# Patient Record
Sex: Female | Born: 1955 | Race: White | Hispanic: No | Smoking: Never smoker
Health system: Southern US, Community
[De-identification: ages and names within clinical notes are randomized; demographics above are authoritative.]

## PROBLEM LIST (undated history)

## (undated) DIAGNOSIS — N952 Postmenopausal atrophic vaginitis: Secondary | ICD-10-CM

## (undated) DIAGNOSIS — R112 Nausea with vomiting, unspecified: Secondary | ICD-10-CM

## (undated) DIAGNOSIS — F419 Anxiety disorder, unspecified: Secondary | ICD-10-CM

## (undated) DIAGNOSIS — Z9889 Other specified postprocedural states: Secondary | ICD-10-CM

## (undated) DIAGNOSIS — M1990 Unspecified osteoarthritis, unspecified site: Secondary | ICD-10-CM

## (undated) HISTORY — PX: COLONOSCOPY: SHX174

## (undated) HISTORY — DX: Hypercalcemia: E83.52

## (undated) HISTORY — DX: Anxiety disorder, unspecified: F41.9

## (undated) HISTORY — DX: Postmenopausal atrophic vaginitis: N95.2

## (undated) HISTORY — PX: KNEE ARTHROSCOPY: SUR90

## (undated) HISTORY — PX: FOOT SURGERY: SHX648

## (undated) HISTORY — DX: Unspecified osteoarthritis, unspecified site: M19.90

---

## 1998-02-18 ENCOUNTER — Other Ambulatory Visit: Admission: RE | Admit: 1998-02-18 | Discharge: 1998-02-18 | Payer: Self-pay | Admitting: Obstetrics and Gynecology

## 2002-10-17 ENCOUNTER — Other Ambulatory Visit: Admission: RE | Admit: 2002-10-17 | Discharge: 2002-10-17 | Payer: Self-pay | Admitting: Internal Medicine

## 2004-02-01 ENCOUNTER — Other Ambulatory Visit: Admission: RE | Admit: 2004-02-01 | Discharge: 2004-02-01 | Payer: Self-pay | Admitting: Internal Medicine

## 2005-02-01 ENCOUNTER — Ambulatory Visit: Payer: Self-pay | Admitting: Internal Medicine

## 2005-02-08 ENCOUNTER — Ambulatory Visit: Payer: Self-pay | Admitting: Internal Medicine

## 2005-02-08 ENCOUNTER — Other Ambulatory Visit: Admission: RE | Admit: 2005-02-08 | Discharge: 2005-02-08 | Payer: Self-pay | Admitting: Internal Medicine

## 2005-04-14 ENCOUNTER — Encounter: Payer: Self-pay | Admitting: Internal Medicine

## 2006-02-19 ENCOUNTER — Ambulatory Visit: Payer: Self-pay | Admitting: Internal Medicine

## 2006-02-27 ENCOUNTER — Ambulatory Visit: Payer: Self-pay | Admitting: Internal Medicine

## 2006-02-27 ENCOUNTER — Encounter: Payer: Self-pay | Admitting: Internal Medicine

## 2006-02-27 ENCOUNTER — Other Ambulatory Visit: Admission: RE | Admit: 2006-02-27 | Discharge: 2006-02-27 | Payer: Self-pay | Admitting: Internal Medicine

## 2006-03-14 ENCOUNTER — Ambulatory Visit: Payer: Self-pay | Admitting: Internal Medicine

## 2006-06-19 ENCOUNTER — Ambulatory Visit: Payer: Self-pay | Admitting: Internal Medicine

## 2007-04-04 ENCOUNTER — Ambulatory Visit: Payer: Self-pay | Admitting: Internal Medicine

## 2007-04-05 ENCOUNTER — Encounter: Payer: Self-pay | Admitting: Internal Medicine

## 2007-04-08 DIAGNOSIS — J309 Allergic rhinitis, unspecified: Secondary | ICD-10-CM | POA: Insufficient documentation

## 2007-04-09 ENCOUNTER — Ambulatory Visit: Payer: Self-pay | Admitting: Internal Medicine

## 2007-04-09 ENCOUNTER — Telehealth: Payer: Self-pay | Admitting: Internal Medicine

## 2007-04-09 LAB — CONVERTED CEMR LAB
Mumps IgG: 1.08 — ABNORMAL HIGH
Rubella: >500 intl units/mL — ABNORMAL HIGH
Rubeola IgG: 2.43 — ABNORMAL HIGH

## 2008-01-01 ENCOUNTER — Telehealth: Payer: Self-pay | Admitting: *Deleted

## 2008-01-07 ENCOUNTER — Ambulatory Visit: Payer: Self-pay | Admitting: Internal Medicine

## 2008-02-04 ENCOUNTER — Ambulatory Visit: Payer: Self-pay | Admitting: Internal Medicine

## 2008-08-17 ENCOUNTER — Encounter: Admission: RE | Admit: 2008-08-17 | Discharge: 2008-08-17 | Payer: Self-pay | Admitting: Obstetrics and Gynecology

## 2008-09-11 ENCOUNTER — Ambulatory Visit: Payer: Self-pay | Admitting: Internal Medicine

## 2008-09-22 ENCOUNTER — Ambulatory Visit: Payer: Self-pay | Admitting: Internal Medicine

## 2008-09-24 ENCOUNTER — Telehealth: Payer: Self-pay | Admitting: Internal Medicine

## 2010-08-22 ENCOUNTER — Telehealth: Payer: Self-pay | Admitting: Internal Medicine

## 2010-09-04 ENCOUNTER — Encounter (HOSPITAL_COMMUNITY): Payer: Self-pay | Admitting: Obstetrics and Gynecology

## 2010-09-15 NOTE — Progress Notes (Signed)
Summary: Call A Nurse   Call-A-Nurse Triage Call Report Triage Record Num: [id number] Operator: Alphonsa Overall Patient Name: Jenevieve Kirschbaum Call Date & Time: 08/20/2010 3:35:48PM Patient Phone: [phone] PCP: Neta Mends. Panosh Patient Gender: Female PCP Fax : [phone] Patient DOB: 1956-02-19 Practice Name: [facility] Reason for Call: Vallerie is calling, regarding a prescription written by Panosh for Effexor 37.5 mg 1 tab qd. Micki states off Effexor x3-4 days due to current stressors. Withdrawal from Effexor. Feeling increasingly agitated. [place] [facility] [phone] last fill date 01/2009 confirmed dose Effexor 37.5mg  tabs qd. Dr Tawanna Cooler paged for questions on safety of resuming script after being off for few days. Per Dr Tawanna Cooler to wait til home to restart medication, did not approve med to be called in out of town. If worsening s/s needs to seek Urgent Care. [facility] aware script not able to be called in, pt hung up phone before care advice able to be given. Protocol(s) Used: Depression Recommended Outcome per Protocol: See Provider within 24 hours Reason for Outcome: Worsening episodes of agitation or irritability Care Advice:  ~ 08/20/2010 4:24:46PM Page 1 of 1 CAN_  Patient called me  out of town about situation as above     had left bottle at home   ...concern about wd .signs ..    told  to call  back.  She texted me later that she found a bottle  with her   later.  [facility] MD

## 2010-09-15 NOTE — Letter (Signed)
Summary: Anner Crete MD  Anner Crete MD   Imported By: Sherian Rein 01/28/2010 10:04:25  _____________________________________________________________________  External Attachment:    Type:   Image     Comment:   External Document

## 2011-09-06 ENCOUNTER — Ambulatory Visit: Payer: 59 | Attending: Specialist | Admitting: Rehabilitative and Restorative Service Providers"

## 2011-09-06 DIAGNOSIS — M542 Cervicalgia: Secondary | ICD-10-CM | POA: Insufficient documentation

## 2011-09-06 DIAGNOSIS — IMO0001 Reserved for inherently not codable concepts without codable children: Secondary | ICD-10-CM | POA: Insufficient documentation

## 2011-09-06 DIAGNOSIS — M25519 Pain in unspecified shoulder: Secondary | ICD-10-CM | POA: Insufficient documentation

## 2011-09-11 ENCOUNTER — Ambulatory Visit: Payer: 59 | Admitting: Rehabilitative and Restorative Service Providers"

## 2011-09-14 ENCOUNTER — Ambulatory Visit: Payer: 59 | Admitting: Rehabilitation

## 2011-09-18 ENCOUNTER — Ambulatory Visit: Payer: 59 | Attending: Specialist | Admitting: Rehabilitative and Restorative Service Providers"

## 2011-09-18 DIAGNOSIS — M25519 Pain in unspecified shoulder: Secondary | ICD-10-CM | POA: Insufficient documentation

## 2011-09-18 DIAGNOSIS — M542 Cervicalgia: Secondary | ICD-10-CM | POA: Insufficient documentation

## 2011-09-18 DIAGNOSIS — IMO0001 Reserved for inherently not codable concepts without codable children: Secondary | ICD-10-CM | POA: Insufficient documentation

## 2011-09-21 ENCOUNTER — Ambulatory Visit: Payer: 59 | Admitting: Rehabilitative and Restorative Service Providers"

## 2011-09-21 ENCOUNTER — Encounter: Payer: 59 | Admitting: Rehabilitative and Restorative Service Providers"

## 2011-09-27 ENCOUNTER — Ambulatory Visit: Payer: 59 | Admitting: Rehabilitation

## 2011-09-29 ENCOUNTER — Ambulatory Visit: Payer: 59 | Admitting: Rehabilitation

## 2011-10-02 ENCOUNTER — Ambulatory Visit: Payer: 59 | Admitting: Rehabilitation

## 2011-10-04 ENCOUNTER — Ambulatory Visit: Payer: 59 | Admitting: Rehabilitation

## 2011-10-11 ENCOUNTER — Ambulatory Visit: Payer: 59 | Admitting: Rehabilitation

## 2012-07-16 ENCOUNTER — Encounter: Payer: Self-pay | Admitting: *Deleted

## 2012-07-16 ENCOUNTER — Telehealth: Payer: Self-pay | Admitting: Internal Medicine

## 2012-07-16 NOTE — Telephone Encounter (Signed)
Scheduled patient on 07/17/12 at 10:15/10:30 AM with Dr. Juanda Chance. Left a message for Clydie Braun at [facility] with appointment. Requested she call me and confirm appointment and give me referring physician.

## 2012-07-16 NOTE — Telephone Encounter (Signed)
Clydie Braun called and will fax records on patient. She will let us know by 4;00 PM if patient will be coming.

## 2012-07-17 ENCOUNTER — Encounter: Payer: Self-pay | Admitting: Internal Medicine

## 2012-07-17 ENCOUNTER — Ambulatory Visit (INDEPENDENT_AMBULATORY_CARE_PROVIDER_SITE_OTHER): Payer: BC Managed Care – PPO | Admitting: Internal Medicine

## 2012-07-17 VITALS — BP 98/62 | HR 66 | Ht 67.0 in | Wt 141.8 lb

## 2012-07-17 DIAGNOSIS — K5900 Constipation, unspecified: Secondary | ICD-10-CM

## 2012-07-17 NOTE — Patient Instructions (Addendum)
Julio Sicks NP

## 2012-07-17 NOTE — Progress Notes (Signed)
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 We saw her 3 years ago for a screening colonoscopy. Her prep was suboptimal although she took the full prep. She has been taking magnesium  products and most recently MiraLax 17 g daily. She tries to eat more fiber and be physically active but her job requires  sedentary work. Her weight has been stable. There is no family history of colon cancer. Her sister has an irritable bowel syndrome. She has been on Effexor. She has been evaluated for elevated calcium levels. She takes probiotics daily.   Past Medical History  Diagnosis Date  . Serum calcium elevated   . Vaginal atrophy   . Osteoarthritis   . Anxiety    Past Surgical History  Procedure Date  . Foot surgery     left; on multiple occasions  . Knee arthroscopy 2001, 2005    left x 2    reports that she has never smoked. She has never used smokeless tobacco. She reports that she drinks alcohol. She reports that she does not use illicit drugs. family history includes Alzheimer's disease in her father; Heart attack in her mother; and Heart disease in her maternal grandmother. Allergies  Allergen Reactions  . Penicillins     REACTION: hives        Review of Systems: Denies upper GI symptoms of nausea vomiting heartburn  The remainder of the 10 point ROS is negative except as outlined in H&P   Physical Exam: General appearance  Well developed, in no distress. Eyes- non icteric. HEENT nontraumatic, normocephalic. Mouth no lesions, tongue papillated, no cheilosis. Neck supple without adenopathy, thyroid not enlarged, no carotid bruits, no JVD. Lungs Clear to auscultation bilaterally. Cor normal S1, normal S2, regular rhythm, no murmur,  quiet precordium. Abdomen: Soft nontender with normoactive bowel sounds. No distention. No tenderness or fullness. Liver edge at costal  margin.  Rectal: Soft Hemoccult negative stool. Extremities no pedal edema. Skin no lesions. Neurological alert and oriented x 3. Psychological normal mood and affect.  Assessment and Plan:  Problem #1 Functional constipation most likely due to slow transit. She has  inadequate fiber intake and lack of physical activity. There is no evidence for occult GI blood loss or signs of pelvic relaxation. He have discussed MiraLax which is an appropriate laxative to take. She may titrate the dose. I have also suggested magnesium oxide and I gave her samples of Linzess 145 ug on a trial basis to see if she she would be interested in taking it. She will continue probiotics. Her last colonoscopy 3 years ago was a suboptimal exam and I suggested a repeat colonoscopy with double prep. She would prefer to wait to see how MiraLax works. I will see her on when an as basis and we will also check her TSH levels.   07/17/2012 Lina Sar

## 2012-07-18 NOTE — Addendum Note (Signed)
Addended by: Richardson Chiquito on: 07/18/2012 12:42 PM   Modules accepted: Orders

## 2012-07-22 ENCOUNTER — Other Ambulatory Visit (INDEPENDENT_AMBULATORY_CARE_PROVIDER_SITE_OTHER): Payer: BC Managed Care – PPO

## 2012-07-22 DIAGNOSIS — K5900 Constipation, unspecified: Secondary | ICD-10-CM

## 2012-07-23 ENCOUNTER — Other Ambulatory Visit: Payer: Self-pay | Admitting: *Deleted

## 2012-07-23 DIAGNOSIS — K5900 Constipation, unspecified: Secondary | ICD-10-CM

## 2012-07-23 MED ORDER — POLYETHYLENE GLYCOL 3350 17 GM/SCOOP PO POWD: 17.0000 g | Freq: Every day | ORAL | Status: DC

## 2012-12-20 ENCOUNTER — Other Ambulatory Visit: Payer: Self-pay | Admitting: *Deleted

## 2012-12-20 DIAGNOSIS — K5900 Constipation, unspecified: Secondary | ICD-10-CM

## 2012-12-20 MED ORDER — POLYETHYLENE GLYCOL 3350 17 GM/SCOOP PO POWD: 17.0000 g | Freq: Every day | ORAL | Status: DC

## 2013-08-20 ENCOUNTER — Encounter: Payer: Self-pay | Admitting: Internal Medicine

## 2014-02-26 ENCOUNTER — Encounter: Payer: Self-pay | Admitting: Internal Medicine

## 2014-04-03 ENCOUNTER — Encounter (HOSPITAL_COMMUNITY): Payer: Self-pay | Admitting: Emergency Medicine

## 2014-04-03 ENCOUNTER — Emergency Department (INDEPENDENT_AMBULATORY_CARE_PROVIDER_SITE_OTHER)
Admission: EM | Admit: 2014-04-03 | Discharge: 2014-04-03 | Disposition: A | Discharge status: Home / Self Care | Payer: No Typology Code available for payment source | Source: Home / Self Care | Attending: Family Medicine | Admitting: Family Medicine

## 2014-04-03 DIAGNOSIS — M25552 Pain in left hip: Secondary | ICD-10-CM

## 2014-04-03 DIAGNOSIS — M25519 Pain in unspecified shoulder: Secondary | ICD-10-CM

## 2014-04-03 DIAGNOSIS — M542 Cervicalgia: Secondary | ICD-10-CM

## 2014-04-03 DIAGNOSIS — M25559 Pain in unspecified hip: Secondary | ICD-10-CM

## 2014-04-03 DIAGNOSIS — M25512 Pain in left shoulder: Secondary | ICD-10-CM

## 2014-04-03 MED ORDER — METHOCARBAMOL 500 MG PO TABS: 500.0000 mg | ORAL_TABLET | Freq: Three times a day (TID) | ORAL | Status: DC | PRN
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
## 2014-04-03 NOTE — Discharge Instructions (Signed)
Thank you for coming in today. Come back or go to the emergency room if you notice new weakness new numbness problems walking or bowel or bladder problems. Take ibuprofen and use Robaxin as needed If not getting better followup with Dr. [facility].  Cervical Sprain A cervical sprain is an injury in the neck in which the strong, fibrous tissues (ligaments) that connect your neck bones stretch or tear. Cervical sprains can range from mild to severe. Severe cervical sprains can cause the neck vertebrae to be unstable. This can lead to damage of the spinal cord and can result in serious nervous system problems. The amount of time it takes for a cervical sprain to get better depends on the cause and extent of the injury. Most cervical sprains heal in 1 to 3 weeks. CAUSES  Severe cervical sprains may be caused by:   Contact sport injuries (such as from football, rugby, wrestling, hockey, auto racing, gymnastics, diving, martial arts, or boxing).   Motor vehicle collisions.   Whiplash injuries. This is an injury from a sudden forward and backward whipping movement of the head and neck.  Falls.  Mild cervical sprains may be caused by:   Being in an awkward position, such as while cradling a telephone between your ear and shoulder.   Sitting in a chair that does not offer proper support.   Working at a poorly Marketing executivedesigned computer station.   Looking up or down for long periods of time.  SYMPTOMS   Pain, soreness, stiffness, or a burning sensation in the front, back, or sides of the neck. This discomfort may develop immediately after the injury or slowly, 24 hours or more after the injury.   Pain or tenderness directly in the middle of the back of the neck.   Shoulder or upper back pain.   Limited ability to move the neck.   Headache.   Dizziness.   Weakness, numbness, or tingling in the hands or arms.   Muscle spasms.   Difficulty swallowing or  chewing.   Tenderness and swelling of the neck.  DIAGNOSIS  Most of the time your health care provider can diagnose a cervical sprain by taking your history and doing a physical exam. Your health care provider will ask about previous neck injuries and any known neck problems, such as arthritis in the neck. X-rays may be taken to find out if there are any other problems, such as with the bones of the neck. Other tests, such as a CT scan or MRI, may also be needed.  TREATMENT  Treatment depends on the severity of the cervical sprain. Mild sprains can be treated with rest, keeping the neck in place (immobilization), and pain medicines. Severe cervical sprains are immediately immobilized. Further treatment is done to help with pain, muscle spasms, and other symptoms and may include:  Medicines, such as pain relievers, numbing medicines, or muscle relaxants.   Physical therapy. This may involve stretching exercises, strengthening exercises, and posture training. Exercises and improved posture can help stabilize the neck, strengthen muscles, and help stop symptoms from returning.  HOME CARE INSTRUCTIONS   Put ice on the injured area.   Put ice in a plastic bag.   Place a towel between your skin and the bag.   Leave the ice on for 15-20 minutes, 3-4 times a day.   If your injury was severe, you may have been given a cervical collar to wear. A cervical collar is a two-piece collar designed to  keep your neck from moving while it heals.  Do not remove the collar unless instructed by your health care provider.  If you have long hair, keep it outside of the collar.  Ask your health care provider before making any adjustments to your collar. Minor adjustments may be required over time to improve comfort and reduce pressure on your chin or on the back of your head.  Ifyou are allowed to remove the collar for cleaning or bathing, follow your health care provider's instructions on how to do so  safely.  Keep your collar clean by wiping it with mild soap and water and drying it completely. If the collar you have been given includes removable pads, remove them every 1-2 days and hand wash them with soap and water. Allow them to air dry. They should be completely dry before you wear them in the collar.  If you are allowed to remove the collar for cleaning and bathing, wash and dry the skin of your neck. Check your skin for irritation or sores. If you see any, tell your health care provider.  Do not drive while wearing the collar.   Only take over-the-counter or prescription medicines for pain, discomfort, or fever as directed by your health care provider.   Keep all follow-up appointments as directed by your health care provider.   Keep all physical therapy appointments as directed by your health care provider.   Make any needed adjustments to your workstation to promote good posture.   Avoid positions and activities that make your symptoms worse.   Warm up and stretch before being active to help prevent problems.  SEEK MEDICAL CARE IF:   Your pain is not controlled with medicine.   You are unable to decrease your pain medicine over time as planned.   Your activity level is not improving as expected.  SEEK IMMEDIATE MEDICAL CARE IF:   You develop any bleeding.  You develop stomach upset.  You have signs of an allergic reaction to your medicine.   Your symptoms get worse.   You develop new, unexplained symptoms.   You have numbness, tingling, weakness, or paralysis in any part of your body.  MAKE SURE YOU:   Understand these instructions.  Will watch your condition.  Will get help right away if you are not doing well or get worse. Document Released: 05/28/2007 Document Revised: 08/05/2013 Document Reviewed: 02/05/2013 [facility] Patient Information 2015 [place], [place]. This information is not intended to replace advice given to you by your health care  provider. Make sure you discuss any questions you have with your health care provider.
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
## 2014-04-10 ENCOUNTER — Ambulatory Visit (INDEPENDENT_AMBULATORY_CARE_PROVIDER_SITE_OTHER): Payer: BC Managed Care – PPO | Admitting: Internal Medicine

## 2014-04-10 DIAGNOSIS — Z7184 Encounter for health counseling related to travel: Secondary | ICD-10-CM

## 2014-04-10 DIAGNOSIS — Z7189 Other specified counseling: Secondary | ICD-10-CM

## 2014-04-10 MED ORDER — ATOVAQUONE-PROGUANIL HCL 250-100 MG PO TABS: 1.0000 | ORAL_TABLET | Freq: Every day | ORAL | Status: DC

## 2014-04-10 NOTE — Progress Notes (Signed)
   Subjective:    Patient ID: Kaitlyn Dominguez, female    DOB: [birth year], 58 y.o.   MRN: [id number]  HPI Going to [place] for mission trip from oct 3-11th. She has been there in 2012 with same mission trip. No difficulties in the past. Did have TD from trip to [place] where she took cipro.  Previous travel to [place], [place], [place], and [place]  Allergies  Allergen Reactions  . Penicillins     REACTION: hives  - although patient reports NKMA   Current Outpatient Prescriptions on File Prior to Visit  Medication Sig Dispense Refill  . celecoxib (CELEBREX) 200 MG capsule Take 200 mg by mouth daily as needed.      . fish oil-omega-3 fatty acids 1000 MG capsule Take 2 g by mouth daily.      Marland Kitchen ibuprofen (ADVIL,MOTRIN) 200 MG tablet Take 200 mg by mouth every 6 (six) hours as needed.      . methocarbamol (ROBAXIN) 500 MG tablet Take 1 tablet (500 mg total) by mouth every 8 (eight) hours as needed for muscle spasms.  60 tablet  0  . Multiple Vitamin (MULTIVITAMIN) tablet Take 1 tablet by mouth daily.      . polyethylene glycol powder (GLYCOLAX/MIRALAX) powder Take 17 g by mouth daily.  255 g  3  . Probiotic Product (PROBIOTIC DAILY PO) Take 1 tablet by mouth daily.      Marland Kitchen venlafaxine (EFFEXOR) 37.5 MG tablet Take 37.5 mg by mouth daily.       No current facility-administered medications on file prior to visit.   Active Ambulatory Problems    Diagnosis Date Noted  . ALLERGIC RHINITIS 04/08/2007   Resolved Ambulatory Problems    Diagnosis Date Noted  . No Resolved Ambulatory Problems   Past Medical History  Diagnosis Date  . Serum calcium elevated   . Vaginal atrophy   . Osteoarthritis   . Anxiety    History  Substance Use Topics  . Smoking status: Never Smoker   . Smokeless tobacco: Never Used  . Alcohol Use: Yes     Comment: 1 drink daily  family history includes Alzheimer's disease in her father; Heart attack in her mother; Heart disease in her maternal grandmother.   Review of  Systems     Objective:   Physical Exam  No exam      Assessment & Plan:  Vaccine = uptodate except for influenza and 2nd hep a. We will check on hep A #2 status and she is to get influenza through work  Malaria proph = will give malarone rx for 18 tabs to start on oct 1st  Traveler's diarrhea = gave precautions, and medical trip will have cipro available

## 2014-09-10 ENCOUNTER — Emergency Department (HOSPITAL_COMMUNITY)
Admission: EM | Admit: 2014-09-10 | Discharge: 2014-09-10 | Disposition: A | Discharge status: Home / Self Care | Payer: No Typology Code available for payment source | Attending: Emergency Medicine | Admitting: Emergency Medicine

## 2014-09-10 ENCOUNTER — Encounter (HOSPITAL_COMMUNITY): Payer: Self-pay | Admitting: Emergency Medicine

## 2014-09-10 ENCOUNTER — Emergency Department (HOSPITAL_COMMUNITY): Payer: No Typology Code available for payment source

## 2014-09-10 DIAGNOSIS — Y998 Other external cause status: Secondary | ICD-10-CM | POA: Insufficient documentation

## 2014-09-10 DIAGNOSIS — Z79899 Other long term (current) drug therapy: Secondary | ICD-10-CM | POA: Insufficient documentation

## 2014-09-10 DIAGNOSIS — Z880 Allergy status to penicillin: Secondary | ICD-10-CM | POA: Insufficient documentation

## 2014-09-10 DIAGNOSIS — Y9389 Activity, other specified: Secondary | ICD-10-CM | POA: Insufficient documentation

## 2014-09-10 DIAGNOSIS — M1990 Unspecified osteoarthritis, unspecified site: Secondary | ICD-10-CM | POA: Insufficient documentation

## 2014-09-10 DIAGNOSIS — W000XXA Fall on same level due to ice and snow, initial encounter: Secondary | ICD-10-CM | POA: Diagnosis not present

## 2014-09-10 DIAGNOSIS — S4992XA Unspecified injury of left shoulder and upper arm, initial encounter: Secondary | ICD-10-CM | POA: Diagnosis present

## 2014-09-10 DIAGNOSIS — Y9289 Other specified places as the place of occurrence of the external cause: Secondary | ICD-10-CM | POA: Insufficient documentation

## 2014-09-10 DIAGNOSIS — Z8742 Personal history of other diseases of the female genital tract: Secondary | ICD-10-CM | POA: Diagnosis not present

## 2014-09-10 DIAGNOSIS — Z8639 Personal history of other endocrine, nutritional and metabolic disease: Secondary | ICD-10-CM | POA: Diagnosis not present

## 2014-09-10 DIAGNOSIS — S43402A Unspecified sprain of left shoulder joint, initial encounter: Secondary | ICD-10-CM | POA: Diagnosis not present

## 2014-09-10 DIAGNOSIS — F419 Anxiety disorder, unspecified: Secondary | ICD-10-CM | POA: Diagnosis not present

## 2014-09-10 MED ORDER — HYDROCODONE-ACETAMINOPHEN 5-325 MG PO TABS
1.0000 | ORAL_TABLET | Freq: Once | ORAL | Status: AC
  Administered 2014-09-10: 1 via ORAL
  Filled 2014-09-10: qty 1

## 2014-09-10 MED ORDER — HYDROCODONE-ACETAMINOPHEN 5-325 MG PO TABS: 1.0000 | ORAL_TABLET | Freq: Four times a day (QID) | ORAL | Status: DC | PRN

## 2014-09-10 NOTE — ED Notes (Signed)
Pt c/o left shoulder pain after slipping and falling on ice; [facility]; CMS intact

## 2014-09-10 NOTE — ED Provider Notes (Signed)
CSN: [id number]     Arrival date & time 09/10/14  1640 History   First MD Initiated Contact with Patient 09/10/14 1645     Chief Complaint  Patient presents with  . Shoulder Pain     (Consider location/radiation/quality/duration/timing/severity/associated sxs/prior Treatment) The history is provided by the patient.  Kaitlyn Dominguez is a 59 y.o. female here presenting with left shoulder injury. She slip and fall on ice yesterday and landed on the left shoulder. Denies any head injury or neck pain. Also hit the left hip but denies pain when she ambulates. Went to urgent care yesterday and was thought to have a possible left shoulder dislocation. She decided to come to the ER today for possible reduction. She was placed in a sling by urgent care and was given tramadol and Motrin.    Past Medical History  Diagnosis Date  . Serum calcium elevated   . Vaginal atrophy   . Osteoarthritis   . Anxiety    Past Surgical History  Procedure Laterality Date  . Foot surgery      left; on multiple occasions  . Knee arthroscopy  2001, 2005    left x 2   Family History  Problem Relation Age of Onset  . Heart attack Mother   . Heart disease Maternal Grandmother   . Alzheimer's disease Father    History  Substance Use Topics  . Smoking status: Never Smoker   . Smokeless tobacco: Never Used  . Alcohol Use: Yes     Comment: 1 drink daily   OB History    No data available     Review of Systems  Musculoskeletal:       L shoulder pain   All other systems reviewed and are negative.     Allergies  Penicillins  Home Medications   Prior to Admission medications   Medication Sig Start Date End Date Taking? Authorizing Provider  atovaquone-proguanil (MALARONE) 250-100 MG TABS Take 1 tablet by mouth daily. Start on oct 1st and take 1 tab daily on full stomach until complete 04/10/14   Judyann Munson, MD  celecoxib (CELEBREX) 200 MG capsule Take 200 mg by mouth daily as needed.    Historical  Provider, MD  fish oil-omega-3 fatty acids 1000 MG capsule Take 2 g by mouth daily.    Historical Provider, MD  ibuprofen (ADVIL,MOTRIN) 200 MG tablet Take 200 mg by mouth every 6 (six) hours as needed.    Historical Provider, MD  methocarbamol (ROBAXIN) 500 MG tablet Take 1 tablet (500 mg total) by mouth every 8 (eight) hours as needed for muscle spasms. 04/03/14   Rodolph Bong, MD  Multiple Vitamin (MULTIVITAMIN) tablet Take 1 tablet by mouth daily.    Historical Provider, MD  polyethylene glycol powder (GLYCOLAX/MIRALAX) powder Take 17 g by mouth daily. 12/20/12   Hart Carwin, MD  Probiotic Product (PROBIOTIC DAILY PO) Take 1 tablet by mouth daily.    Historical Provider, MD  venlafaxine (EFFEXOR) 37.5 MG tablet Take 37.5 mg by mouth daily.    Historical Provider, MD   BP 123/59 mmHg  Pulse 64  Temp(Src) 97.8 F (36.6 C) (Oral)  Resp 20  SpO2 100% Physical Exam  Constitutional: She is oriented to person, place, and time. She appears well-developed and well-nourished.  HENT:  Head: Normocephalic and atraumatic.  Mouth/Throat: Oropharynx is clear and moist.  Eyes: Conjunctivae are normal. Pupils are equal, round, and reactive to light.  Neck: Normal range of motion. Neck supple.  Cardiovascular: Normal rate, regular rhythm and normal heart sounds.   Pulmonary/Chest: Effort normal and breath sounds normal. No respiratory distress. She has no wheezes. She has no rales.  Abdominal: Soft. Bowel sounds are normal. She exhibits no distension. There is no tenderness.  Musculoskeletal:  L arm sling in place. Dec ROM L shoulder, no obvious dislocation. Tenderness AC joint. Nl ROM bilateral hips. No other extremity trauma   Neurological: She is alert and oriented to person, place, and time. No cranial nerve deficit. Coordination normal.  Skin: Skin is warm and dry.  Psychiatric: She has a normal mood and affect. Her behavior is normal. Judgment and thought content normal.  Nursing note and vitals  reviewed.   ED Course  Procedures (including critical care time) Labs Review Labs Reviewed - No data to display  Imaging Review Dg Shoulder Left  09/10/2014   CLINICAL DATA:  59 year old female with left shoulder pain after slipping on ice and falling at her home  EXAM: LEFT SHOULDER - 2+ VIEW  COMPARISON:  None.  FINDINGS: There is no evidence of fracture or dislocation. There is no evidence of arthropathy or other focal bone abnormality. Soft tissues are unremarkable.  IMPRESSION: Negative.   Electronically Signed   By: Malachy MoanHeath  McCullough M.D.   On: 09/10/2014 17:34     EKG Interpretation None      MDM   Final diagnoses:  None   Kaitlyn Dominguez is a 59 y.o. female here with L shoulder pain. I reviewed xray from urgent care and was not convinced that she has L shoulder dislocation. Will repeat xray. No signs of head or neck injury.   5:41 PM Xray showed no fracture or dislocation. Can wear sling for comfort. Will add vicodin instead of tramadol.    Richardean Canalavid H Takila Kronberg, MD 09/10/14 [phone]

## 2014-09-10 NOTE — Discharge Instructions (Signed)
No heavy lifting for a week.   Wear sling for comfort.   Take motrin for pain.   Take vicodin for severe pain. Do NOT drive with it.   Start moving the arm in 2-3 days.   Follow up with your doctor.   Return to ER if you have severe pain, vomiting, unable to move arm.

## 2014-10-06 ENCOUNTER — Other Ambulatory Visit: Payer: Self-pay | Admitting: Obstetrics and Gynecology

## 2014-10-07 LAB — CYTOLOGY - PAP

## 2016-09-22 ENCOUNTER — Ambulatory Visit (INDEPENDENT_AMBULATORY_CARE_PROVIDER_SITE_OTHER): Payer: Managed Care, Other (non HMO) | Admitting: Internal Medicine

## 2016-09-22 DIAGNOSIS — Z23 Encounter for immunization: Secondary | ICD-10-CM | POA: Diagnosis not present

## 2016-09-22 DIAGNOSIS — Z7184 Encounter for health counseling related to travel: Secondary | ICD-10-CM

## 2016-09-22 DIAGNOSIS — Z789 Other specified health status: Secondary | ICD-10-CM

## 2016-09-22 DIAGNOSIS — Z7189 Other specified counseling: Secondary | ICD-10-CM

## 2016-09-22 DIAGNOSIS — Z9189 Other specified personal risk factors, not elsewhere classified: Secondary | ICD-10-CM | POA: Diagnosis not present

## 2016-09-22 MED ORDER — AZITHROMYCIN 500 MG PO TABS: 500.0000 mg | ORAL_TABLET | Freq: Every day | ORAL | 0 refills | Status: DC

## 2016-09-22 NOTE — Progress Notes (Signed)
Subjective:   Kaitlyn Dominguez is a 61 y.o. female who presents to the Infectious Disease clinic for travel consultation. Going to [place], [place], [place] Planned departure date: march 16 Planned return date: march 28 Countries of travel: [place], [place] Areas in country: urban, and national parks Accommodations: hotels Purpose of travel:leisure, guided tour of 20 Prior travel out of [place]: yes     Objective:   Medications: reviewed    Assessment:   No contraindications to travel. none Plan:   Travel vaccines = will give hep A #2, tdap booster  Traveler's diarrhea = gave rx for azithromycin and precautions  Mosquito bite prevention = gave recs on using DEET and premethrin

## 2017-05-04 ENCOUNTER — Encounter: Payer: Self-pay | Admitting: Internal Medicine

## 2017-06-26 ENCOUNTER — Encounter: Payer: Self-pay | Admitting: Adult Health

## 2017-06-26 ENCOUNTER — Ambulatory Visit: Payer: Self-pay | Admitting: *Deleted

## 2017-06-26 ENCOUNTER — Ambulatory Visit: Payer: Self-pay | Admitting: Adult Health

## 2017-06-26 VITALS — BP 120/82 | Temp 98.0°F | Wt 139.0 lb

## 2017-06-26 DIAGNOSIS — W57XXXA Bitten or stung by nonvenomous insect and other nonvenomous arthropods, initial encounter: Secondary | ICD-10-CM

## 2017-06-26 DIAGNOSIS — S70362A Insect bite (nonvenomous), left thigh, initial encounter: Secondary | ICD-10-CM

## 2017-06-26 MED ORDER — DOXYCYCLINE HYCLATE 100 MG PO CAPS: 100.0000 mg | ORAL_CAPSULE | Freq: Two times a day (BID) | ORAL | 0 refills | Status: DC

## 2017-06-26 NOTE — Telephone Encounter (Signed)
She found a tick embedded in her left groin area this morning.   She removed it but not sure she got it all.   It was a very small tick, smaller than she had ever seen before.   She called [facility] thinking it was still an urgent care center.  There is a red ring around the bite with blood under the skin at the bite location.  It's very sore.  She did walk in woods on Sunday.   An appt was made with Shirline Freesory Nafziger, NP for 2:15 today.  Reason for Disposition . Red ring or bull's-eye rash occurs at tick bite  Answer Assessment - Initial Assessment Questions 1. TYPE of TICK: "Is it a wood tick or a deer tick?" If unsure, ask: "What size was the tick?" "Did it look more like a watermelon seed or a poppy seed?"      Appleseed size.   Very small.  Found the tick this morning and removed it.   It was embedded.   Not sure if I got it all.  There is a bite mark with a red ring around it.   There blood under the skin at the bite.   2. LOCATION: "Where is the tick bite located?"      Left groin area 3. ONSET: "How long do you think the tick was attached before you removed it?" (Hours or days)      Went walking in woods on Sunday. 4. TETANUS: "When was the last tetanus booster?"      Up to date. 5. PREGNANCY: "Is there any chance you are pregnant?" "When was your last menstrual period?"     No  Protocols used: TICK BITE-A-AH

## 2017-06-26 NOTE — Progress Notes (Signed)
Subjective:    Patient ID: Kaitlyn Dominguez, female    DOB: [birth year], 61 y.o.   MRN: [id number]  HPI  61 year old female who  has a past medical history of Anxiety, Osteoarthritis, Serum calcium elevated, and Vaginal atrophy. She presents to the office today for the acute complaint of a tick bite. She reports being in the woods late last week and noticed a tick bite on her left upper leg/groin. She was able to remove the tick entirely. Today she reports pain, redness and a bullseye mark.   She denies any fevers, joint pain, or rash    Review of Systems See HPI   Past Medical History:  Diagnosis Date  . Anxiety   . Osteoarthritis   . Serum calcium elevated   . Vaginal atrophy     Social History   Socioeconomic History  . Marital status: Legally Separated    Spouse name: Not on file  . Number of children: 3  . Years of education: Not on file  . Highest education level: Not on file  Social Needs  . Financial resource strain: Not on file  . Food insecurity - worry: Not on file  . Food insecurity - inability: Not on file  . Transportation needs - medical: Not on file  . Transportation needs - non-medical: Not on file  Occupational History    Employer: [employer]  Tobacco Use  . Smoking status: Never Smoker  . Smokeless tobacco: Never Used  Substance and Sexual Activity  . Alcohol use: Yes    Comment: 1 drink daily  . Drug use: No  . Sexual activity: Not on file  Other Topics Concern  . Not on file  Social History Narrative  . Not on file    Past Surgical History:  Procedure Laterality Date  . FOOT SURGERY     left; on multiple occasions  . KNEE ARTHROSCOPY  2001, 2005   left x 2    Family History  Problem Relation Age of Onset  . Heart attack Mother   . Heart disease Maternal Grandmother   . Alzheimer's disease Father     Allergies  Allergen Reactions  . Penicillins     REACTION: hives    Current Outpatient Medications  on File Prior to Visit  Medication Sig Dispense Refill  . ibuprofen (ADVIL,MOTRIN) 200 MG tablet Take 200 mg by mouth every 6 (six) hours as needed.    Marland Kitchen. LORazepam (ATIVAN) 0.5 MG tablet Take 0.5 mg 2 (two) times daily as needed by mouth.  3  . polyethylene glycol powder (GLYCOLAX/MIRALAX) powder Take 17 g by mouth daily. 255 g 3  . venlafaxine (EFFEXOR) 37.5 MG tablet Take 37.5 mg by mouth daily.     No current facility-administered medications on file prior to visit.     BP 120/82 (BP Location: Left Arm)   Temp 98 F (36.7 C) (Oral)   Wt 139 lb (63 kg)   BMI 21.77 kg/m       Objective:   Physical Exam  Constitutional: She is oriented to person, place, and time. She appears well-developed and well-nourished. No distress.  Cardiovascular: Normal rate, regular rhythm, normal heart sounds and intact distal pulses. Exam reveals no gallop and no friction rub.  No murmur heard. Pulmonary/Chest: Effort normal and breath sounds normal. No respiratory distress. She has no wheezes. She has no rales. She exhibits no tenderness.  Neurological: She is alert and oriented to person,  place, and time.  Skin: Skin is warm and dry. No rash noted. She is not diaphoretic. There is erythema. No pallor.  Tick bite noted on left upper leg  + Erythema migrans - no drainage noted - Entire tick was removed   Psychiatric: She has a normal mood and affect. Her behavior is normal. Judgment and thought content normal.  Nursing note and vitals reviewed.      Assessment & Plan:  1. Tick bite, initial encounter - Will treat with doxycyline  - doxycycline (VIBRAMYCIN) 100 MG capsule; Take 1 capsule (100 mg total) 2 (two) times daily by mouth.  Dispense: 20 capsule; Refill: 0 - Follow up as needed  Shirline Freesory Jashaun Penrose, NP

## 2017-08-03 DIAGNOSIS — M19041 Primary osteoarthritis, right hand: Secondary | ICD-10-CM | POA: Diagnosis not present

## 2017-08-16 DIAGNOSIS — R69 Illness, unspecified: Secondary | ICD-10-CM | POA: Diagnosis not present

## 2017-08-23 DIAGNOSIS — R69 Illness, unspecified: Secondary | ICD-10-CM | POA: Diagnosis not present

## 2017-09-06 DIAGNOSIS — R69 Illness, unspecified: Secondary | ICD-10-CM | POA: Diagnosis not present

## 2017-09-13 DIAGNOSIS — R69 Illness, unspecified: Secondary | ICD-10-CM | POA: Diagnosis not present

## 2017-09-27 DIAGNOSIS — R69 Illness, unspecified: Secondary | ICD-10-CM | POA: Diagnosis not present

## 2017-10-02 DIAGNOSIS — S93402A Sprain of unspecified ligament of left ankle, initial encounter: Secondary | ICD-10-CM | POA: Diagnosis not present

## 2017-10-02 DIAGNOSIS — S8002XA Contusion of left knee, initial encounter: Secondary | ICD-10-CM | POA: Diagnosis not present

## 2017-10-04 DIAGNOSIS — R69 Illness, unspecified: Secondary | ICD-10-CM | POA: Diagnosis not present

## 2017-10-11 DIAGNOSIS — R69 Illness, unspecified: Secondary | ICD-10-CM | POA: Diagnosis not present

## 2017-10-18 DIAGNOSIS — R69 Illness, unspecified: Secondary | ICD-10-CM | POA: Diagnosis not present

## 2017-10-25 DIAGNOSIS — R69 Illness, unspecified: Secondary | ICD-10-CM | POA: Diagnosis not present

## 2017-11-01 DIAGNOSIS — R69 Illness, unspecified: Secondary | ICD-10-CM | POA: Diagnosis not present

## 2017-11-15 DIAGNOSIS — R69 Illness, unspecified: Secondary | ICD-10-CM | POA: Diagnosis not present

## 2017-11-22 DIAGNOSIS — R69 Illness, unspecified: Secondary | ICD-10-CM | POA: Diagnosis not present

## 2017-12-13 DIAGNOSIS — R69 Illness, unspecified: Secondary | ICD-10-CM | POA: Diagnosis not present

## 2017-12-20 DIAGNOSIS — R69 Illness, unspecified: Secondary | ICD-10-CM | POA: Diagnosis not present

## 2018-01-02 ENCOUNTER — Encounter: Payer: Self-pay | Admitting: Nurse Practitioner

## 2018-01-03 DIAGNOSIS — R69 Illness, unspecified: Secondary | ICD-10-CM | POA: Diagnosis not present

## 2018-01-24 ENCOUNTER — Encounter: Payer: Self-pay | Admitting: Nurse Practitioner

## 2018-01-24 ENCOUNTER — Ambulatory Visit: Payer: 59 | Admitting: Nurse Practitioner

## 2018-01-24 VITALS — BP 90/60 | HR 73 | Ht 67.0 in | Wt 139.0 lb

## 2018-01-24 DIAGNOSIS — K5900 Constipation, unspecified: Secondary | ICD-10-CM | POA: Diagnosis not present

## 2018-01-24 DIAGNOSIS — Z1211 Encounter for screening for malignant neoplasm of colon: Secondary | ICD-10-CM | POA: Diagnosis not present

## 2018-01-24 DIAGNOSIS — R69 Illness, unspecified: Secondary | ICD-10-CM | POA: Diagnosis not present

## 2018-01-24 MED ORDER — NA SULFATE-K SULFATE-MG SULF 17.5-3.13-1.6 GM/177ML PO SOLN: ORAL | 0 refills | Status: DC

## 2018-01-24 NOTE — Progress Notes (Signed)
Chief Complaint: constipation  Referring Provider:  self      ASSESSMENT AND PLAN;   1. 62 yo female with chronic constipation despite good hydration and exercise.  -Miralax QD-QOD.  -also she wants to add flaxseed to food. -if no improvement then she will add daily Benefiber.  -if still no improvement then consider trial of amitiza or linzess.   2. Colon cancer screening.  Last colonoscopy 2010.  The exam was complete but prep was poor.  Patient did not have recommended 5-year follow-up exam.   -She would like to proceed with colonoscopy for colon cancer screening. The risks and benefits of colonoscopy with possible polypectomy were discussed and the patient agrees to proceed.   3. Hemorrhoids. Sounds like she has internal hemorrhoids which protrude with defecation.  -treat constipation.  -avoid straining, use Glycerin supp as needed -she is interested in trying hemorrhoidal cream. Trial of Prep H inside rectum nightly for 7 days.  -brief discussion about banding in case more conservative measures fail.     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  Patient has chronic constipation, takes Miralax as needed. Occasionally sees small amount of blood with BM but has problems with hemorrhoids. Daily Miralax has caused loose stools with oozing at times. She drinks adequate of fluid, exercises and eats fiber. Constipation always gets worse when travelling. She prefers to take Miralax every day rather than trying Linzess or Amitia. No abdominal pain. No N/V, weight is stable. No GERD sx. No urinary sx.    Past Surgical History:  Procedure Laterality Date  . FOOT SURGERY     left; on multiple occasions  . KNEE ARTHROSCOPY  2001, 2005   left x 2   Family History  Problem Relation Age of Onset  . Heart attack Mother   . Heart disease Maternal Grandmother   . Alzheimer's disease Father    Social History   Tobacco Use  .  Smoking status: Never Smoker  . Smokeless tobacco: Never Used  Substance Use Topics  . Alcohol use: Yes    Comment: 1 drink daily  . Drug use: No   Current Outpatient Medications  Medication Sig Dispense Refill  . ibuprofen (ADVIL,MOTRIN) 200 MG tablet Take 200 mg by mouth every 6 (six) hours as needed.    Marland Kitchen LORazepam (ATIVAN) 0.5 MG tablet Take 0.5 mg 2 (two) times daily as needed by mouth.  3  . polyethylene glycol powder (GLYCOLAX/MIRALAX) powder Take 17 g by mouth daily. 255 g 3  . venlafaxine (EFFEXOR) 37.5 MG tablet Take 37.5 mg by mouth daily.     No current facility-administered medications for this visit.    Allergies  Allergen Reactions  . Penicillins     REACTION: hives    Creatinine clearance cannot be calculated (No order found.)   Review of Systems: All systems reviewed and negative except where noted in HPI.   Physical Exam:    Wt Readings from Last 3 Encounters:  01/24/18 139 lb (63 kg)  06/26/17 139 lb (63 kg)  07/17/12 141 lb 12.8 oz (64.3 kg)    BP 90/60   Pulse 73   Ht 5\' 7"  (1.702 m)   Wt 139 lb (63 kg)   BMI 21.77 kg/m  Constitutional:  Pleasant female in no acute distress. Psychiatric: Normal mood and affect. Behavior is normal. EENT: Pupils normal.  Conjunctivae are normal. No scleral icterus. Neck supple.  Cardiovascular: Normal rate, regular rhythm. No edema Pulmonary/chest: Effort normal and breath sounds normal. No wheezing, rales or rhonchi. Abdominal: Soft, nondistended, nontender. Bowel sounds active throughout. There are no masses palpable. No hepatomegaly. Neurological: Alert and oriented to person place and time. Skin: Skin is warm and dry. No rashes noted.  Willette ClusterPaula Guenther, NP  01/24/2018, 8:54 AM

## 2018-01-24 NOTE — Patient Instructions (Signed)
If you are age 60 or older, your body mass index should be between 23-30. Your Body mass index is 21.77 kg/m. If this is out of the aforementioned range listed, please consider follow up with your Primary Care Provider.  If you are age 60 or younger, your body mass index should be between 19-25. Your Body mass index is 21.77 kg/m. If this is out of the aformentioned range listed, please consider follow up with your Primary Care Provider.   You have been scheduled for a colonoscopy. Please follow written instructions given to you at your visit today.  Please pick up your prep supplies at the pharmacy within the next 1-3 days. If you use inhalers (even only as needed), please bring them with you on the day of your procedure. Your physician has requested that you go to www.startemmi.com and enter the access code given to you at your visit today. This web site gives a general overview about your procedure. However, you should still follow specific instructions given to you by our office regarding your preparation for the procedure.  We have sent the following medications to your pharmacy for you to pick up at your convenience: Suprep  Use Preparation H Cream at bedtime for 7 days.  Start Miralax as directed.  Start Citrucel daily.  Thank you for choosing me and [facility].   Willette ClusterPaula Guenther, NP

## 2018-01-25 NOTE — Progress Notes (Signed)
Thank you for sending this case to me. I have reviewed the entire note, and the outlined plan seems appropriate.   Randolf Sansoucie Danis, MD  

## 2018-02-07 DIAGNOSIS — R69 Illness, unspecified: Secondary | ICD-10-CM | POA: Diagnosis not present

## 2018-02-21 DIAGNOSIS — R69 Illness, unspecified: Secondary | ICD-10-CM | POA: Diagnosis not present

## 2018-02-22 ENCOUNTER — Encounter: Payer: Self-pay | Admitting: Gastroenterology

## 2018-03-04 ENCOUNTER — Encounter: Payer: Self-pay | Admitting: Family Medicine

## 2018-03-04 ENCOUNTER — Ambulatory Visit: Payer: 59 | Admitting: Family Medicine

## 2018-03-04 ENCOUNTER — Encounter: Payer: Self-pay | Admitting: *Deleted

## 2018-03-04 VITALS — BP 104/70 | HR 81 | Temp 100.8°F | Resp 16 | Ht 67.0 in | Wt 139.0 lb

## 2018-03-04 DIAGNOSIS — R509 Fever, unspecified: Secondary | ICD-10-CM | POA: Diagnosis not present

## 2018-03-04 DIAGNOSIS — G4483 Primary cough headache: Secondary | ICD-10-CM

## 2018-03-04 LAB — POCT INFLUENZA A/B
Influenza A, POC: NEGATIVE
Influenza B, POC: NEGATIVE

## 2018-03-04 LAB — POCT RAPID STREP A (OFFICE): Rapid Strep A Screen: NEGATIVE

## 2018-03-04 NOTE — Progress Notes (Signed)
  Subjective:     Patient ID: Kaitlyn Dominguez, female   DOB: 1956-06-04, 62 y.o.   MRN: [id number]  HPI Patient is seen as a work in with acute illness. Onset yesterday of fever, chills, body aches, headache, earache, cough. She's also had some sore throat today. No nasal congestion. [place] Her friend that she was traveling with called this morning and has exactly the same symptoms. They recall someone on the bus sitting next to them coughing frequently during the trip.  Denies any nausea or vomiting. No skin rash. No tick bites. She took some Tylenol this morning but none since then. She works as a Therapist, musichospice nurse  Past Medical History:  Diagnosis Date  . Anxiety   . Osteoarthritis   . Serum calcium elevated   . Vaginal atrophy    Past Surgical History:  Procedure Laterality Date  . FOOT SURGERY     left; on multiple occasions  . KNEE ARTHROSCOPY  2001, 2005   left x 2    reports that she has never smoked. She has never used smokeless tobacco. She reports that she drinks alcohol. She reports that she does not use drugs. family history includes Alzheimer's disease in her father; Heart attack in her mother; Heart disease in her maternal grandmother. Allergies  Allergen Reactions  . Penicillins     REACTION: hives     Review of Systems  Constitutional: Positive for chills, fatigue and fever.  HENT: Positive for ear pain and sore throat. Negative for ear discharge.   Respiratory: Positive for cough. Negative for shortness of breath.   Gastrointestinal: Negative for abdominal pain.  Genitourinary: Negative for dysuria.  Musculoskeletal: Positive for myalgias.  Skin: Negative for rash.  Neurological: Positive for headaches.       Objective:   Physical Exam  Constitutional: She appears well-developed and well-nourished.  HENT:  Oropharynx mild posterior pharynx erythema. No exudate  Eyes:  Question small effusion right eardrum. Only mild  erythema involving handle of malleus bilaterally  Neck: Neck supple. No neck rigidity.  Cardiovascular: Normal rate and regular rhythm.  Pulmonary/Chest: Effort normal and breath sounds normal. No respiratory distress. She has no wheezes. She has no rales.  Musculoskeletal: She exhibits no edema.  Lymphadenopathy:    She has no cervical adenopathy.  Skin: No rash noted.       Assessment:     Acute respiratory infection. Suspect viral. Rapid strep and influenza screen negative. Patient nontoxic in appearance    Plan:     -Continue plenty of fluids and rest -Continue Tylenol and/or Motrin for body aches -Work note written to not return until at least 24 hours after a febrile -Follow-up immediately for worsening symptoms, new symptoms, or other concerns  Kristian CoveyBruce W Ellamarie Naeve MD [facility]

## 2018-03-04 NOTE — Patient Instructions (Signed)

## 2018-03-12 DIAGNOSIS — Z6821 Body mass index (BMI) 21.0-21.9, adult: Secondary | ICD-10-CM | POA: Diagnosis not present

## 2018-03-12 DIAGNOSIS — Z01419 Encounter for gynecological examination (general) (routine) without abnormal findings: Secondary | ICD-10-CM | POA: Diagnosis not present

## 2018-03-12 DIAGNOSIS — Z1231 Encounter for screening mammogram for malignant neoplasm of breast: Secondary | ICD-10-CM | POA: Diagnosis not present

## 2018-03-12 DIAGNOSIS — Z1329 Encounter for screening for other suspected endocrine disorder: Secondary | ICD-10-CM | POA: Diagnosis not present

## 2018-03-12 DIAGNOSIS — L659 Nonscarring hair loss, unspecified: Secondary | ICD-10-CM | POA: Diagnosis not present

## 2018-03-14 DIAGNOSIS — R69 Illness, unspecified: Secondary | ICD-10-CM | POA: Diagnosis not present

## 2018-03-19 ENCOUNTER — Encounter: Payer: 59 | Admitting: Gastroenterology

## 2018-03-21 DIAGNOSIS — R69 Illness, unspecified: Secondary | ICD-10-CM | POA: Diagnosis not present

## 2018-04-11 DIAGNOSIS — R69 Illness, unspecified: Secondary | ICD-10-CM | POA: Diagnosis not present

## 2018-04-19 ENCOUNTER — Encounter: Payer: Self-pay | Admitting: Gastroenterology

## 2018-04-19 ENCOUNTER — Ambulatory Visit: Payer: 59 | Admitting: *Deleted

## 2018-04-19 VITALS — Ht 67.0 in | Wt 139.0 lb

## 2018-04-19 DIAGNOSIS — Z1211 Encounter for screening for malignant neoplasm of colon: Secondary | ICD-10-CM

## 2018-04-19 NOTE — Progress Notes (Signed)
PT has already watched the Emmi video  No egg or soy allergy  No home oxygen used or hx of sleep apnea  No anesthesia or intubation problems   No diet medications needed  Pt has Suprep at home already; she was instructed regarding two day prep

## 2018-05-01 ENCOUNTER — Telehealth: Payer: Self-pay | Admitting: *Deleted

## 2018-05-01 ENCOUNTER — Telehealth: Payer: Self-pay | Admitting: Gastroenterology

## 2018-05-01 NOTE — Telephone Encounter (Signed)
Patient was instructed to complete her 2 day prep as ordered and discussed with her in preop not to stop after the first part with miralax and dulcolax were completed but to take the suprep as well. SM

## 2018-05-02 ENCOUNTER — Ambulatory Visit (AMBULATORY_SURGERY_CENTER): Payer: 59 | Admitting: Gastroenterology

## 2018-05-02 ENCOUNTER — Encounter: Payer: Self-pay | Admitting: Gastroenterology

## 2018-05-02 VITALS — BP 98/68 | HR 60 | Temp 97.5°F | Resp 9 | Ht 67.0 in | Wt 139.0 lb

## 2018-05-02 DIAGNOSIS — Z1211 Encounter for screening for malignant neoplasm of colon: Secondary | ICD-10-CM | POA: Diagnosis not present

## 2018-05-02 MED ORDER — SODIUM CHLORIDE 0.9 % IV SOLN: 500.0000 mL | Freq: Once | INTRAVENOUS | Status: DC

## 2018-05-02 NOTE — Progress Notes (Signed)
Pt's states no medical or surgical changes since previsit or office visit. 

## 2018-05-02 NOTE — Op Note (Signed)
[facility] Patient Name: Kaitlyn Dominguez Procedure Date: 05/02/2018 9:07 AM MRN: [id number] Endoscopist: Sherilyn Cooter L. Myrtie Neither , MD Age: 62 Referring MD:  Date of Birth: Feb 25, 1956 Gender: Female Account #: [id number] Procedure:                Colonoscopy Indications:              Screening for colorectal malignant neoplasm (first                            screening colonoscopy 2010 - poor prep) Medicines:                Monitored Anesthesia Care Procedure:                Pre-Anesthesia Assessment:                           - Prior to the procedure, a History and Physical                            was performed, and patient medications and                            allergies were reviewed. The patient's tolerance of                            previous anesthesia was also reviewed. The risks                            and benefits of the procedure and the sedation                            options and risks were discussed with the patient.                            All questions were answered, and informed consent                            was obtained. Prior Anticoagulants: The patient has                            taken no previous anticoagulant or antiplatelet                            agents. ASA Grade Assessment: II - A patient with                            mild systemic disease. After reviewing the risks                            and benefits, the patient was deemed in                            satisfactory condition to undergo the procedure.  After obtaining informed consent, the colonoscope                            was passed under direct vision. Throughout the                            procedure, the patient's blood pressure, pulse, and                            oxygen saturations were monitored continuously. The                            Colonoscope was introduced through the anus and                            advanced to the the  cecum, identified by                            appendiceal orifice and ileocecal valve. The                            colonoscopy was somewhat difficult due to a                            redundant colon. Successful completion of the                            procedure was aided by using manual pressure. The                            patient tolerated the procedure well. The quality                            of the bowel preparation was excellent. The                            ileocecal valve, appendiceal orifice, and rectum                            were photographed. The quality of the bowel                            preparation was evaluated using the BBPS [facility]                            Bowel Preparation Scale) with scores of: Right                            Colon = 3, Transverse Colon = 3 and Left Colon = 3                            (entire mucosa seen well with no residual staining,  small fragments of stool or opaque liquid). The                            total BBPS score equals 9. The bowel preparation                            used was 2 day Suprep/Miralax. Scope In: 9:13:24 AM Scope Out: 9:32:25 AM Scope Withdrawal Time: 0 hours 10 minutes 50 seconds  Total Procedure Duration: 0 hours 19 minutes 1 second  Findings:                 The perianal and digital rectal examinations were                            normal.                           The colon (entire examined portion) was redundant.                           A few diverticula were found in the right colon.                           The exam was otherwise without abnormality on                            direct and retroflexion views. Complications:            No immediate complications. Estimated Blood Loss:     Estimated blood loss: none. Impression:               - Redundant colon.                           - Diverticulosis in the right colon.                           - The  examination was otherwise normal on direct                            and retroflexion views.                           - No specimens collected. Recommendation:           - Patient has a contact number available for                            emergencies. The signs and symptoms of potential                            delayed complications were discussed with the                            patient. Return to normal activities tomorrow.  Written discharge instructions were provided to the                            patient.                           - Resume previous diet.                           - Continue present medications.                           - Repeat colonoscopy in 10 years for screening                            purposes. Xavius Spadafore L. Myrtie Neitheranis, MD 05/02/2018 9:36:30 AM This report has been signed electronically.

## 2018-05-02 NOTE — Telephone Encounter (Signed)
See TE from 05-01-18 from Maralyn SagoSarah Monday RN

## 2018-05-02 NOTE — Progress Notes (Signed)
Report given to PACU, vss 

## 2018-05-02 NOTE — Patient Instructions (Signed)
HANDOUT GIVEN FOR DIVERTICULOSIS  YOU HAD AN ENDOSCOPIC PROCEDURE TODAY AT THE [facility]:   Refer to the procedure report that was given to you for any specific questions about what was found during the examination.  If the procedure report does not answer your questions, please call your gastroenterologist to clarify.  If you requested that your care partner not be given the details of your procedure findings, then the procedure report has been included in a sealed envelope for you to review at your convenience later.  YOU SHOULD EXPECT: Some feelings of bloating in the abdomen. Passage of more gas than usual.  Walking can help get rid of the air that was put into your GI tract during the procedure and reduce the bloating. If you had a lower endoscopy (such as a colonoscopy or flexible sigmoidoscopy) you may notice spotting of blood in your stool or on the toilet paper. If you underwent a bowel prep for your procedure, you may not have a normal bowel movement for a few days.  Please Note:  You might notice some irritation and congestion in your nose or some drainage.  This is from the oxygen used during your procedure.  There is no need for concern and it should clear up in a day or so.  SYMPTOMS TO REPORT IMMEDIATELY:   Following lower endoscopy (colonoscopy or flexible sigmoidoscopy):  Excessive amounts of blood in the stool  Significant tenderness or worsening of abdominal pains  Swelling of the abdomen that is new, acute  Fever of 100F or higher   For urgent or emergent issues, a gastroenterologist can be reached at any hour by calling [phone].   DIET:  We do recommend a small meal at first, but then you may proceed to your regular diet.  Drink plenty of fluids but you should avoid alcoholic beverages for 24 hours.  ACTIVITY:  You should plan to take it easy for the rest of today and you should NOT DRIVE or use heavy machinery until tomorrow (because of the sedation  medicines used during the test).    FOLLOW UP: Our staff will call the number listed on your records the next business day following your procedure to check on you and address any questions or concerns that you may have regarding the information given to you following your procedure. If we do not reach you, we will leave a message.  However, if you are feeling well and you are not experiencing any problems, there is no need to return our call.  We will assume that you have returned to your regular daily activities without incident.  If any biopsies were taken you will be contacted by phone or by letter within the next 1-3 weeks.  Please call us at [phone] if you have not heard about the biopsies in 3 weeks.    SIGNATURES/CONFIDENTIALITY: You and/or your care partner have signed paperwork which will be entered into your electronic medical record.  These signatures attest to the fact that that the information above on your After Visit Summary has been reviewed and is understood.  Full responsibility of the confidentiality of this discharge information lies with you and/or your care-partner. 

## 2018-05-03 ENCOUNTER — Telehealth: Payer: Self-pay | Admitting: Gastroenterology

## 2018-05-03 ENCOUNTER — Telehealth: Payer: Self-pay | Admitting: *Deleted

## 2018-05-03 DIAGNOSIS — R69 Illness, unspecified: Secondary | ICD-10-CM | POA: Diagnosis not present

## 2018-05-03 NOTE — Telephone Encounter (Signed)
Pt said she is returning your call Pt said she is light headed today [phone]

## 2018-05-03 NOTE — Telephone Encounter (Signed)
I spoke with the patient and she stated that she was "a little light headed due to the propofol. I explained that the medicine is usually gone within a half hour of recovery.   No pain. No vomiting.  No fever noted. Patient reported eating well. I told her to force fluids and take it easy today.  I told her that it probably didn't have anything to do with the procedure, but to see her primary care physician if it gets worse.  She stated that "it wasn't bad, but she thought that she'd better report it."

## 2018-05-03 NOTE — Telephone Encounter (Signed)
Attempted to call and had to leave a message.  The patient did not answer.

## 2018-05-03 NOTE — Telephone Encounter (Signed)
  Follow up Call-  Call back number 05/02/2018  Post procedure Call Back phone  # [phone]  Permission to leave phone message Yes  Some recent data might be hidden     Patient questions:  Do you have a fever, pain , or abdominal swelling? No. Pain Score  0 *  Have you tolerated food without any problems? Yes.    Have you been able to return to your normal activities? Yes.    Do you have any questions about your discharge instructions: Diet   No. Medications  No. Follow up visit  No.  Do you have questions or concerns about your Care? No.  Actions: * If pain score is 4 or above: No action needed, pain <4.

## 2018-05-03 NOTE — Telephone Encounter (Signed)
Routed to endoscopy nurse. 

## 2018-05-04 DIAGNOSIS — Z23 Encounter for immunization: Secondary | ICD-10-CM | POA: Diagnosis not present

## 2018-05-09 DIAGNOSIS — R69 Illness, unspecified: Secondary | ICD-10-CM | POA: Diagnosis not present

## 2018-05-16 DIAGNOSIS — R69 Illness, unspecified: Secondary | ICD-10-CM | POA: Diagnosis not present

## 2018-05-30 DIAGNOSIS — R69 Illness, unspecified: Secondary | ICD-10-CM | POA: Diagnosis not present

## 2018-06-18 DIAGNOSIS — R69 Illness, unspecified: Secondary | ICD-10-CM | POA: Diagnosis not present

## 2018-06-27 DIAGNOSIS — R69 Illness, unspecified: Secondary | ICD-10-CM | POA: Diagnosis not present

## 2018-07-18 DIAGNOSIS — R69 Illness, unspecified: Secondary | ICD-10-CM | POA: Diagnosis not present

## 2018-08-01 DIAGNOSIS — R69 Illness, unspecified: Secondary | ICD-10-CM | POA: Diagnosis not present

## 2018-08-22 DIAGNOSIS — R69 Illness, unspecified: Secondary | ICD-10-CM | POA: Diagnosis not present

## 2018-09-12 DIAGNOSIS — R69 Illness, unspecified: Secondary | ICD-10-CM | POA: Diagnosis not present

## 2018-10-24 DIAGNOSIS — R69 Illness, unspecified: Secondary | ICD-10-CM | POA: Diagnosis not present

## 2018-11-07 DIAGNOSIS — R69 Illness, unspecified: Secondary | ICD-10-CM | POA: Diagnosis not present

## 2018-11-21 DIAGNOSIS — R69 Illness, unspecified: Secondary | ICD-10-CM | POA: Diagnosis not present

## 2018-12-06 DIAGNOSIS — R69 Illness, unspecified: Secondary | ICD-10-CM | POA: Diagnosis not present

## 2018-12-19 DIAGNOSIS — R69 Illness, unspecified: Secondary | ICD-10-CM | POA: Diagnosis not present

## 2019-01-09 DIAGNOSIS — R69 Illness, unspecified: Secondary | ICD-10-CM | POA: Diagnosis not present

## 2019-01-30 DIAGNOSIS — R69 Illness, unspecified: Secondary | ICD-10-CM | POA: Diagnosis not present

## 2019-02-20 DIAGNOSIS — R69 Illness, unspecified: Secondary | ICD-10-CM | POA: Diagnosis not present

## 2019-04-03 DIAGNOSIS — R69 Illness, unspecified: Secondary | ICD-10-CM | POA: Diagnosis not present

## 2019-04-10 DIAGNOSIS — D1801 Hemangioma of skin and subcutaneous tissue: Secondary | ICD-10-CM | POA: Diagnosis not present

## 2019-04-10 DIAGNOSIS — L738 Other specified follicular disorders: Secondary | ICD-10-CM | POA: Diagnosis not present

## 2019-04-10 DIAGNOSIS — D225 Melanocytic nevi of trunk: Secondary | ICD-10-CM | POA: Diagnosis not present

## 2019-04-10 DIAGNOSIS — L814 Other melanin hyperpigmentation: Secondary | ICD-10-CM | POA: Diagnosis not present

## 2019-04-10 DIAGNOSIS — D485 Neoplasm of uncertain behavior of skin: Secondary | ICD-10-CM | POA: Diagnosis not present

## 2019-04-10 DIAGNOSIS — L821 Other seborrheic keratosis: Secondary | ICD-10-CM | POA: Diagnosis not present

## 2019-04-10 DIAGNOSIS — L280 Lichen simplex chronicus: Secondary | ICD-10-CM | POA: Diagnosis not present

## 2019-04-10 DIAGNOSIS — L700 Acne vulgaris: Secondary | ICD-10-CM | POA: Diagnosis not present

## 2019-05-01 DIAGNOSIS — R69 Illness, unspecified: Secondary | ICD-10-CM | POA: Diagnosis not present

## 2019-05-13 DIAGNOSIS — L080 Pyoderma: Secondary | ICD-10-CM | POA: Diagnosis not present

## 2019-05-22 DIAGNOSIS — R69 Illness, unspecified: Secondary | ICD-10-CM | POA: Diagnosis not present

## 2019-05-23 DIAGNOSIS — Z23 Encounter for immunization: Secondary | ICD-10-CM | POA: Diagnosis not present

## 2019-06-12 DIAGNOSIS — R69 Illness, unspecified: Secondary | ICD-10-CM | POA: Diagnosis not present

## 2019-07-03 DIAGNOSIS — R69 Illness, unspecified: Secondary | ICD-10-CM | POA: Diagnosis not present

## 2019-07-17 DIAGNOSIS — R69 Illness, unspecified: Secondary | ICD-10-CM | POA: Diagnosis not present

## 2019-07-31 DIAGNOSIS — R69 Illness, unspecified: Secondary | ICD-10-CM | POA: Diagnosis not present

## 2019-08-04 NOTE — Progress Notes (Signed)
[facility] [address] [place], [place] [address] Phone: [phone] Subjective:   I Kaitlyn Dominguez am serving as a Neurosurgeon for Dr. Antoine Primas.  This visit occurred during the SARS-CoV-2 public health emergency.  Safety protocols were in place, including screening questions prior to the visit, additional usage of staff PPE, and extensive cleaning of exam room while observing appropriate contact time as indicated for disinfecting solutions.   I'm seeing this patient by the request  of:  Panosh, Kaitlyn Mends, MD    CC: Right shoulder pain  [id number]  Kaitlyn Dominguez is a 63 y.o. female coming in with complaint of right shoulder pain. States the arm is weak. Patient does yoga twice a week. About a week ago she remembers reaching for the steering wheel when she felt a sharp pain. Fell on ice years ago on the same shoulder. Pain keeps her up at night. Loss of ROM. Patient often has neck tightness on the right side.   Onset- Chronic  Location - Posterior  Duration-  Character- sharp, dull  Aggravating factors- flexion, holding things  Reliving factors-  Therapies tried- Celebrex  Severity-   8-9/10 at its worse      Past Medical History:  Diagnosis Date  . Anxiety   . Osteoarthritis   . Serum calcium elevated   . Vaginal atrophy    Past Surgical History:  Procedure Laterality Date  . COLONOSCOPY    . FOOT SURGERY     left; on multiple occasions  . KNEE ARTHROSCOPY  2001, 2005   left x 2   Social History   Socioeconomic History  . Marital status: Legally Separated    Spouse name: Not on file  . Number of children: 3  . Years of education: Not on file  . Highest education level: Not on file  Occupational History    Employer: [employer]  Tobacco Use  . Smoking status: Never Smoker  . Smokeless tobacco: Never Used  Substance and Sexual Activity  . Alcohol use: Yes    Comment: 1 drink daily  . Drug use: No    . Sexual activity: Not on file  Other Topics Concern  . Not on file  Social History Narrative  . Not on file   Social Determinants of Health   Financial Resource Strain:   . Difficulty of Paying Living Expenses: Not on file  Food Insecurity:   . Worried About Programme researcher, broadcasting/film/video in the Last Year: Not on file  . Ran Out of Food in the Last Year: Not on file  Transportation Needs:   . Lack of Transportation (Medical): Not on file  . Lack of Transportation (Non-Medical): Not on file  Physical Activity:   . Days of Exercise per Week: Not on file  . Minutes of Exercise per Session: Not on file  Stress:   . Feeling of Stress : Not on file  Social Connections:   . Frequency of Communication with Friends and Family: Not on file  . Frequency of Social Gatherings with Friends and Family: Not on file  . Attends Religious Services: Not on file  . Active Member of Clubs or Organizations: Not on file  . Attends Banker Meetings: Not on file  . Marital Status: Not on file   Allergies  Allergen Reactions  . Penicillins     REACTION: hives   Family History  Problem Relation Age of Onset  .  Heart attack Mother   . Heart disease Maternal Grandmother   . Alzheimer's disease Father   . Colon cancer Neg Hx   . Esophageal cancer Neg Hx   . Rectal cancer Neg Hx   . Stomach cancer Neg Hx        Current Outpatient Medications (Analgesics):  .  ibuprofen (ADVIL,MOTRIN) 200 MG tablet, Take 200 mg by mouth every 6 (six) hours as needed.   Current Outpatient Medications (Other):  Marland Kitchen.  LORazepam (ATIVAN) 0.5 MG tablet, Take 0.5 mg 2 (two) times daily as needed by mouth. .  polyethylene glycol powder (GLYCOLAX/MIRALAX) powder, Take 17 g by mouth daily. Marland Kitchen.  venlafaxine (EFFEXOR) 37.5 MG tablet, Take 37.5 mg by mouth daily. .  Diclofenac Sodium (PENNSAID) 2 % SOLN, Place 2 g onto the skin 2 (two) times daily. .  Vitamin D, Ergocalciferol, (DRISDOL) 1.25 MG (50000 UT) CAPS capsule,  Take 1 capsule (50,000 Units total) by mouth every 7 (seven) days.    Past medical history, social, surgical and family history all reviewed in electronic medical record.  No pertanent information unless stated regarding to the chief complaint.   Review of Systems:  No headache, visual changes, nausea, vomiting, diarrhea, constipation, dizziness, abdominal pain, skin rash, fevers, chills, night sweats, weight loss, swollen lymph nodes, body aches, joint swelling, muscle aches, chest pain, shortness of breath, mood changes.   Objective  Blood pressure 100/74, pulse 75, height 5\' 7"  (1.702 m), weight 141 lb (64 kg), SpO2 96 %.    General: No apparent distress alert and oriented x3 mood and affect normal, dressed appropriately.  HEENT: Pupils equal, extraocular movements intact  Respiratory: Patient's speak in full sentences and does not appear short of breath  Cardiovascular: No lower extremity edema, non tender, no erythema  Skin: Warm dry intact with no signs of infection or rash on extremities or on axial skeleton.  Abdomen: Soft nontender  Neuro: Cranial nerves II through XII are intact, neurovascularly intact in all extremities with 2+ DTRs and 2+ pulses.  Lymph: No lymphadenopathy of posterior or anterior cervical chain or axillae bilaterally.  Gait normal with good balance and coordination.  MSK:  Non tender with full range of motion and good stability and symmetric strength and tone of elbows, wrist, hip, knee and ankles bilaterally.  Shoulder: Right Inspection reveals no abnormalities, atrophy or asymmetry. Palpation is normal with no tenderness over AC joint or bicipital groove. ROM is full in all planes passively. Rotator cuff strength normal throughout. signs of impingement with positive Neer and Hawkin's tests, but negative empty can sign. Speeds and Yergason's tests normal. No labral pathology noted with negative Obrien's, negative clunk and good stability. Normal scapular  function observed. No painful arc and no drop arm sign. No apprehension sign  MSK US performed of: Right This study was ordered, performed, and interpreted by Terrilee FilesZach Jonavan Vanhorn D.O.  Shoulder:   Supraspinatus:  Appears normal on long and transverse views, Bursal bulge seen with shoulder abduction on impingement view. Infraspinatus:  Appears normal on long and transverse views. Significant increase in Doppler flow Subscapularis:  Appears normal on long and transverse views. Positive bursa seems to be more of a subdeltoid bursa Teres Minor:  Appears normal on long and transverse views. AC joint:  Capsule distention noted. Glenohumeral Joint:  Appears normal without effusion. Glenoid Labrum:  Intact without visualized tears. Biceps Tendon:  Appears normal on long and transverse views, no fraying of tendon, tendon located in intertubercular groove, no subluxation  with shoulder internal or external rotation.  Impression: Subacromial bursitis  Procedure: Real-time Ultrasound Guided Injection of right glenohumeral joint Device: GE Logiq E  Ultrasound guided injection is preferred based studies that show increased duration, increased effect, greater accuracy, decreased procedural pain, increased response rate with ultrasound guided versus blind injection.  Verbal informed consent obtained.  Time-out conducted.  Noted no overlying erythema, induration, or other signs of local infection.  Skin prepped in a sterile fashion.  Local anesthesia: Topical Ethyl chloride.  With sterile technique and under real time ultrasound guidance:  Joint visualized.  23g 1  inch needle inserted anterior approach. Pictures taken for needle placement. Patient did have injection of 2 cc of 1% lidocaine, 2 cc of 0.5% Marcaine, and 1.0 cc of Kenalog 40 mg/dL. Completed without difficulty  Pain immediately resolved suggesting accurate placement of the medication.  Advised to call if fevers/chills, erythema, induration,  drainage, or persistent bleeding.  Images permanently stored and available for review in the ultrasound unit.  Impression: Technically successful ultrasound guided injection.  Procedure: Real-time Ultrasound Guided Injection of right acromioclavicular joint Device: GE Logiq Q7 Ultrasound guided injection is preferred based studies that show increased duration, increased effect, greater accuracy, decreased procedural pain, increased response rate, and decreased cost with ultrasound guided versus blind injection.  Verbal informed consent obtained.  Time-out conducted.  Noted no overlying erythema, induration, or other signs of local infection.  Skin prepped in a sterile fashion.  Local anesthesia: Topical Ethyl chloride.  With sterile technique and under real time ultrasound guidance: With a 25-gauge half inch needle injected with 0.5 cc of 0.5% Marcaine and 0.5 cc of Kenalog 40 mg/mL Completed without difficulty  Pain immediately resolved suggesting accurate placement of the medication.  Advised to call if fevers/chills, erythema, induration, drainage, or persistent bleeding.  Images permanently stored and available for review in the ultrasound unit.  Impression: Technically successful ultrasound guided injection.   Impression and Recommendations:     This case required medical decision making of moderate complexity. The above documentation has been reviewed and is accurate and complete Lyndal Pulley, DO       Note: This dictation was prepared with Dragon dictation along with smaller phrase technology. Any transcriptional errors that result from this process are unintentional.

## 2019-08-05 ENCOUNTER — Encounter: Payer: Self-pay | Admitting: Family Medicine

## 2019-08-05 ENCOUNTER — Ambulatory Visit (INDEPENDENT_AMBULATORY_CARE_PROVIDER_SITE_OTHER): Payer: 59

## 2019-08-05 ENCOUNTER — Other Ambulatory Visit: Payer: Self-pay

## 2019-08-05 ENCOUNTER — Ambulatory Visit (INDEPENDENT_AMBULATORY_CARE_PROVIDER_SITE_OTHER): Payer: 59 | Admitting: Family Medicine

## 2019-08-05 VITALS — BP 100/74 | HR 75 | Ht 67.0 in | Wt 141.0 lb

## 2019-08-05 DIAGNOSIS — G8929 Other chronic pain: Secondary | ICD-10-CM | POA: Diagnosis not present

## 2019-08-05 DIAGNOSIS — M25511 Pain in right shoulder: Secondary | ICD-10-CM

## 2019-08-05 DIAGNOSIS — M19011 Primary osteoarthritis, right shoulder: Secondary | ICD-10-CM | POA: Diagnosis not present

## 2019-08-05 DIAGNOSIS — M7551 Bursitis of right shoulder: Secondary | ICD-10-CM | POA: Diagnosis not present

## 2019-08-05 DIAGNOSIS — M19019 Primary osteoarthritis, unspecified shoulder: Secondary | ICD-10-CM | POA: Insufficient documentation

## 2019-08-05 MED ORDER — VITAMIN D (ERGOCALCIFEROL) 1.25 MG (50000 UNIT) PO CAPS: 50000.0000 [IU] | ORAL_CAPSULE | ORAL | 0 refills | Status: DC

## 2019-08-05 MED ORDER — PENNSAID 2 % EX SOLN: 2.0000 g | Freq: Two times a day (BID) | CUTANEOUS | 3 refills | Status: DC

## 2019-08-05 NOTE — Patient Instructions (Addendum)
Good to see you.  Ice 20 minutes 2 times daily. Usually after activity and before bed. Exercises 3 times a week.  pennsaid pinkie amount topically 2 times daily as needed.  Once weekly vitamin D for 12 weeks.   Xray today See me again in 4 weeks

## 2019-08-05 NOTE — Assessment & Plan Note (Signed)
Aspiration done today, discussed icing regimen, topical anti-inflammatories, over-the-counter medications, we discussed keeping hands within peripheral vision.  Patient is going to be once weekly vitamin D significant arthritic changes noted.  Follow-up again in 4 to 8 weeks worsening symptoms consider possible further imaging

## 2019-08-05 NOTE — Assessment & Plan Note (Signed)
Injection given today, tolerated the procedure well, discussed with activity do which wants to avoid.  Topical anti-inflammatories.  Follow-up again in 4 to 8 weeks

## 2019-08-28 DIAGNOSIS — R69 Illness, unspecified: Secondary | ICD-10-CM | POA: Diagnosis not present

## 2019-09-04 ENCOUNTER — Ambulatory Visit: Payer: 59 | Admitting: Family Medicine

## 2019-09-04 ENCOUNTER — Ambulatory Visit (INDEPENDENT_AMBULATORY_CARE_PROVIDER_SITE_OTHER): Payer: 59

## 2019-09-04 ENCOUNTER — Encounter: Payer: Self-pay | Admitting: Family Medicine

## 2019-09-04 ENCOUNTER — Other Ambulatory Visit: Payer: Self-pay

## 2019-09-04 VITALS — BP 104/78 | HR 75 | Ht 67.0 in | Wt 140.0 lb

## 2019-09-04 DIAGNOSIS — G8929 Other chronic pain: Secondary | ICD-10-CM

## 2019-09-04 DIAGNOSIS — M25511 Pain in right shoulder: Secondary | ICD-10-CM

## 2019-09-04 DIAGNOSIS — M7551 Bursitis of right shoulder: Secondary | ICD-10-CM | POA: Diagnosis not present

## 2019-09-04 NOTE — Patient Instructions (Signed)
Exercises 3x a week Body Helix brace Wear HOKA Arahi or Gravity Defyer shoes See me in 6 weekes

## 2019-09-04 NOTE — Assessment & Plan Note (Signed)
Patient still has hoarseness is noted at this point.  Discussed icing regimen and home exercise, which activities to do which will still avoid.  We discussed possible aspiration given her advanced imaging.  Patient wanted to hold at this time, follow-up again in 6 weeks

## 2019-09-04 NOTE — Progress Notes (Signed)
[facility] [address] [place] [address] Phone: [phone] Subjective:   Kaitlyn Dominguez, am serving as a scribe for Dr. Antoine Primas. This visit occurred during the SARS-CoV-2 public health emergency.  Safety protocols were in place, including screening questions prior to the visit, additional usage of staff PPE, and extensive cleaning of exam room while observing appropriate contact time as indicated for disinfecting solutions.   I'm seeing this patient by the request  of:  Panosh, Neta Mends, MD  CC: Shoulder pain follow-up  [id number]   08/05/2019 Aspiration done today, discussed icing regimen, topical anti-inflammatories, over-the-counter medications, we discussed keeping hands within peripheral vision.  Patient is going to be once weekly vitamin D significant arthritic changes noted.  Follow-up again in 4 to 8 weeks worsening symptoms consider possible further imaging  Update 09/04/2019 Kaitlyn Dominguez is a 64 y.o. female coming in with complaint of right shoulder pain. Patient states that she was doing good but brought in a grocery bag this weekend that made her shoulder pain increase. Pain achy in character.  Patient would state that overall though feeling approximately 80% better.     Past Medical History:  Diagnosis Date  . Anxiety   . Osteoarthritis   . Serum calcium elevated   . Vaginal atrophy    Past Surgical History:  Procedure Laterality Date  . COLONOSCOPY    . FOOT SURGERY     left; on multiple occasions  . KNEE ARTHROSCOPY  2001, 2005   left x 2   Social History   Socioeconomic History  . Marital status: Legally Separated    Spouse name: Not on file  . Number of children: 3  . Years of education: Not on file  . Highest education level: Not on file  Occupational History    Employer: [employer]  Tobacco Use  . Smoking status: Never Smoker  . Smokeless tobacco: Never Used    Substance and Sexual Activity  . Alcohol use: Yes    Comment: 1 drink daily  . Drug use: No  . Sexual activity: Not on file  Other Topics Concern  . Not on file  Social History Narrative  . Not on file   Social Determinants of Health   Financial Resource Strain:   . Difficulty of Paying Living Expenses: Not on file  Food Insecurity:   . Worried About Programme researcher, broadcasting/film/video in the Last Year: Not on file  . Ran Out of Food in the Last Year: Not on file  Transportation Needs:   . Lack of Transportation (Medical): Not on file  . Lack of Transportation (Non-Medical): Not on file  Physical Activity:   . Days of Exercise per Week: Not on file  . Minutes of Exercise per Session: Not on file  Stress:   . Feeling of Stress : Not on file  Social Connections:   . Frequency of Communication with Friends and Family: Not on file  . Frequency of Social Gatherings with Friends and Family: Not on file  . Attends Religious Services: Not on file  . Active Member of Clubs or Organizations: Not on file  . Attends Banker Meetings: Not on file  . Marital Status: Not on file   Allergies  Allergen Reactions  . Penicillins     REACTION: hives   Family History  Problem Relation Age of Onset  . Heart attack Mother   . Heart disease Maternal  Grandmother   . Alzheimer's disease Father   . Colon cancer Neg Hx   . Esophageal cancer Neg Hx   . Rectal cancer Neg Hx   . Stomach cancer Neg Hx        Current Outpatient Medications (Analgesics):  .  ibuprofen (ADVIL,MOTRIN) 200 MG tablet, Take 200 mg by mouth every 6 (six) hours as needed.   Current Outpatient Medications (Other):  Marland Kitchen  Diclofenac Sodium (PENNSAID) 2 % SOLN, Place 2 g onto the skin 2 (two) times daily. Marland Kitchen  LORazepam (ATIVAN) 0.5 MG tablet, Take 0.5 mg 2 (two) times daily as needed by mouth. .  polyethylene glycol powder (GLYCOLAX/MIRALAX) powder, Take 17 g by mouth daily. Marland Kitchen  venlafaxine (EFFEXOR) 37.5 MG tablet, Take  37.5 mg by mouth daily. .  Vitamin D, Ergocalciferol, (DRISDOL) 1.25 MG (50000 UT) CAPS capsule, Take 1 capsule (50,000 Units total) by mouth every 7 (seven) days.    Past medical history, social, surgical and family history all reviewed in electronic medical record.  No pertanent information unless stated regarding to the chief complaint.   Review of Systems:  No headache, visual changes, nausea, vomiting, diarrhea, constipation, dizziness, abdominal pain, skin rash, fevers, chills, night sweats, weight loss, swollen lymph nodes, body aches, joint swelling, chest pain, shortness of breath, mood changes. POSITIVE muscle aches  Objective  Blood pressure 104/78, pulse 75, height 5\' 7"  (1.702 m), weight 140 lb (63.5 kg), SpO2 99 %.   General: No apparent distress alert and oriented x3 mood and affect normal, dressed appropriately.  HEENT: Pupils equal, extraocular movements intact  Respiratory: Patient's speak in full sentences and does not appear short of breath  Cardiovascular: No lower extremity edema, non tender, no erythema  Skin: Warm dry intact with no signs of infection or rash on extremities or on axial skeleton.  Abdomen: Soft nontender  Neuro: Cranial nerves II through XII are intact, neurovascularly intact in all extremities with 2+ DTRs and 2+ pulses.  Lymph: No lymphadenopathy of posterior or anterior cervical chain or axillae bilaterally.  Gait normal with good balance and coordination.  MSK:  Non tender with full range of motion and good stability and symmetric strength and tone of  elbows, wrist, hip, knee and ankles bilaterally.  Right shoulder exam still has positive Hawkins.  Patient will have improvement in range of motion and strength.  Neurovascularly intact distally.  Limited musculoskeletal ultrasound was performed and interpreted by Lyndal Pulley  Limited ultrasound shows the patient does have hypoechoic change which seems to be cyst on the anterior aspect of the  shoulder.  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  Some mild degenerative changes of the supraspinatus tendon.   Impression and Recommendations:     This case required medical decision making of moderate complexity. The above documentation has been reviewed and is accurate and complete Lyndal Pulley, DO       Note: This dictation was prepared with Dragon dictation along with smaller phrase technology. Any transcriptional errors that result from this process are unintentional.

## 2019-09-17 ENCOUNTER — Encounter: Payer: Self-pay | Admitting: Family Medicine

## 2019-09-17 ENCOUNTER — Other Ambulatory Visit: Payer: Self-pay | Admitting: Family Medicine

## 2019-09-18 MED ORDER — VITAMIN D (ERGOCALCIFEROL) 1.25 MG (50000 UNIT) PO CAPS: 50000.0000 [IU] | ORAL_CAPSULE | ORAL | 0 refills | Status: DC

## 2019-10-02 DIAGNOSIS — R69 Illness, unspecified: Secondary | ICD-10-CM | POA: Diagnosis not present

## 2019-10-16 ENCOUNTER — Ambulatory Visit: Payer: 59 | Admitting: Family Medicine

## 2019-10-16 ENCOUNTER — Ambulatory Visit (INDEPENDENT_AMBULATORY_CARE_PROVIDER_SITE_OTHER): Payer: 59

## 2019-10-16 ENCOUNTER — Other Ambulatory Visit: Payer: Self-pay

## 2019-10-16 ENCOUNTER — Encounter: Payer: Self-pay | Admitting: Family Medicine

## 2019-10-16 VITALS — BP 110/70 | HR 77 | Ht 67.0 in | Wt 140.0 lb

## 2019-10-16 DIAGNOSIS — G8929 Other chronic pain: Secondary | ICD-10-CM

## 2019-10-16 DIAGNOSIS — M25511 Pain in right shoulder: Secondary | ICD-10-CM

## 2019-10-16 DIAGNOSIS — M19011 Primary osteoarthritis, right shoulder: Secondary | ICD-10-CM | POA: Diagnosis not present

## 2019-10-16 DIAGNOSIS — M7551 Bursitis of right shoulder: Secondary | ICD-10-CM

## 2019-10-16 MED ORDER — VITAMIN D (ERGOCALCIFEROL) 1.25 MG (50000 UNIT) PO CAPS: 50000.0000 [IU] | ORAL_CAPSULE | ORAL | 0 refills | Status: DC

## 2019-10-16 NOTE — Progress Notes (Signed)
[place] Phone: [phone] Subjective:   Kaitlyn Dominguez, am serving as a scribe for Dr. Hulan Saas. This visit occurred during the SARS-CoV-2 public health emergency.  Safety protocols were in place, including screening questions prior to the visit, additional usage of staff PPE, and extensive cleaning of exam room while observing appropriate contact time as indicated for disinfecting solutions.   I'm seeing this patient by the request  of:  Panosh, Standley Brooking, MD  CC: Shoulder pain  [id number]   09/04/2019 Patient still has hoarseness is noted at this point.  Discussed icing regimen and home exercise, which activities to do which will still avoid.  We discussed possible aspiration given her advanced imaging.  Patient wanted to hold at this time, follow-up again in 6 weeks  Update 10/16/2019 Kaitlyn Dominguez is a 64 y.o. female coming in with complaint of right shoulder pain. Patient states that her pain has not improved since last visit. Did not sleep last night due to dull achy pain. Pain is in right scapula and tricep today. Does feel weakness in the shoulder.  Patient had what appeared to be more of a perilabral cyst that did respond fairly well to aspiration previously.  Feels like the pain is getting worse again, if anything may be now worsening recently been started.  She was 80% better at last follow-up but now having difficulty with even sleeping on the right side and daily activities such as dressing has become more concerning.    Past Medical History:  Diagnosis Date  . Anxiety   . Osteoarthritis   . Serum calcium elevated   . Vaginal atrophy    Past Surgical History:  Procedure Laterality Date  . COLONOSCOPY    . FOOT SURGERY     left; on multiple occasions  . KNEE ARTHROSCOPY  2001, 2005   left x 2   Social History   Socioeconomic History  . Marital status: Legally Separated    Spouse name:  Not on file  . Number of children: 3  . Years of education: Not on file  . Highest education level: Not on file  Occupational History    Employer: [employer]  Tobacco Use  . Smoking status: Never Smoker  . Smokeless tobacco: Never Used  Substance and Sexual Activity  . Alcohol use: Yes    Comment: 1 drink daily  . Drug use: Dominguez  . Sexual activity: Not on file  Other Topics Concern  . Not on file  Social History Narrative  . Not on file   Social Determinants of Health   Financial Resource Strain:   . Difficulty of Paying Living Expenses: Not on file  Food Insecurity:   . Worried About Charity fundraiser in the Last Year: Not on file  . Ran Out of Food in the Last Year: Not on file  Transportation Needs:   . Lack of Transportation (Medical): Not on file  . Lack of Transportation (Non-Medical): Not on file  Physical Activity:   . Days of Exercise per Week: Not on file  . Minutes of Exercise per Session: Not on file  Stress:   . Feeling of Stress : Not on file  Social Connections:   . Frequency of Communication with Friends and Family: Not on file  . Frequency of Social Gatherings with Friends and Family: Not on file  . Attends Religious Services: Not on file  .  Active Member of Clubs or Organizations: Not on file  . Attends Banker Meetings: Not on file  . Marital Status: Not on file   Allergies  Allergen Reactions  . Penicillins     REACTION: hives   Family History  Problem Relation Age of Onset  . Heart attack Mother   . Heart disease Maternal Grandmother   . Alzheimer's disease Father   . Colon cancer Neg Hx   . Esophageal cancer Neg Hx   . Rectal cancer Neg Hx   . Stomach cancer Neg Hx        Current Outpatient Medications (Analgesics):  .  ibuprofen (ADVIL,MOTRIN) 200 MG tablet, Take 200 mg by mouth every 6 (six) hours as needed.   Current Outpatient Medications (Other):  Marland Kitchen  Diclofenac Sodium (PENNSAID) 2 %  SOLN, Place 2 g onto the skin 2 (two) times daily. Marland Kitchen  LORazepam (ATIVAN) 0.5 MG tablet, Take 0.5 mg 2 (two) times daily as needed by mouth. .  polyethylene glycol powder (GLYCOLAX/MIRALAX) powder, Take 17 g by mouth daily. Marland Kitchen  venlafaxine (EFFEXOR) 37.5 MG tablet, Take 37.5 mg by mouth daily. .  Vitamin D, Ergocalciferol, (DRISDOL) 1.25 MG (50000 UNIT) CAPS capsule, Take 1 capsule (50,000 Units total) by mouth every 7 (seven) days. .  Vitamin D, Ergocalciferol, (DRISDOL) 1.25 MG (50000 UNIT) CAPS capsule, Take 1 capsule (50,000 Units total) by mouth every 7 (seven) days.   Reviewed prior external information including notes and imaging from  primary care provider As well as notes that were available from care everywhere and other healthcare systems.  Past medical history, social, surgical and family history all reviewed in electronic medical record.  Dominguez pertanent information unless stated regarding to the chief complaint.   Review of Systems:  Dominguez headache, visual changes, nausea, vomiting, diarrhea, constipation, dizziness, abdominal pain, skin rash, fevers, chills, night sweats, weight loss, swollen lymph nodes, body aches, joint swelling, chest pain, shortness of breath, mood changes. POSITIVE muscle aches  Objective  Blood pressure 110/70, pulse 77, height 5\' 7"  (1.702 m), weight 140 lb (63.5 kg), SpO2 99 %.   General: Dominguez apparent distress alert and oriented x3 mood and affect normal, dressed appropriately.  HEENT: Pupils equal, extraocular movements intact  Respiratory: Patient's speak in full sentences and does not appear short of breath  Cardiovascular: Dominguez lower extremity edema, non tender, Dominguez erythema  Skin: Warm dry intact with Dominguez signs of infection or rash on extremities or on axial skeleton.  Abdomen: Soft nontender  Neuro: Cranial nerves II through XII are intact, neurovascularly intact in all extremities with 2+ DTRs and 2+ pulses.  Lymph: Dominguez lymphadenopathy of posterior or  anterior cervical chain or axillae bilaterally.  Gait normal with good balance and coordination.  MSK:  Non tender with full range of motion and good stability and symmetric strength and tone of , elbows, wrist, hip, knee and ankles bilaterally.  Right shoulder exam shows the patient does have swelling noted over the acromioclavicular joint.  Moderate arthritic changes. Positive impingement. 4/5 strength of on right   Ltd msk us/ of right shoulder by shows re-accumulation anterior shoulder of the cyst noted. Rotator cuff intact. Ac arthritis noted and abnormal distal clavicle but Dominguez fracture noted.     Impression and Recommendations:     This case required medical decision making of moderate complexity. The above documentation has been reviewed and is accurate and complete Sanjuana Letters, DO  Note: This dictation was prepared with Dragon dictation along with smaller phrase technology. Any transcriptional errors that result from this process are unintentional.

## 2019-10-16 NOTE — Patient Instructions (Signed)
MRI [facility] Vit D Once weekly  Duexis 1 pill 3x a day for 3 days Will contact you following MRI

## 2019-10-16 NOTE — Assessment & Plan Note (Signed)
Concern for cyst likely labral in nature. Worsening symptoms, failed conservative therapy. Need MRI. Depending on finding could consider PRP or repeat injection

## 2019-10-28 DIAGNOSIS — R69 Illness, unspecified: Secondary | ICD-10-CM | POA: Diagnosis not present

## 2019-11-06 ENCOUNTER — Other Ambulatory Visit: Payer: Self-pay | Admitting: Family Medicine

## 2019-11-06 ENCOUNTER — Other Ambulatory Visit (HOSPITAL_COMMUNITY)
Admission: RE | Admit: 2019-11-06 | Discharge: 2019-11-06 | Disposition: A | Discharge status: Home / Self Care | Payer: 59 | Source: Ambulatory Visit | Attending: Family Medicine | Admitting: Family Medicine

## 2019-11-06 DIAGNOSIS — Z124 Encounter for screening for malignant neoplasm of cervix: Secondary | ICD-10-CM | POA: Insufficient documentation

## 2019-11-06 DIAGNOSIS — K5900 Constipation, unspecified: Secondary | ICD-10-CM | POA: Diagnosis not present

## 2019-11-06 DIAGNOSIS — Z0000 Encounter for general adult medical examination without abnormal findings: Secondary | ICD-10-CM | POA: Diagnosis not present

## 2019-11-06 DIAGNOSIS — R69 Illness, unspecified: Secondary | ICD-10-CM | POA: Diagnosis not present

## 2019-11-06 DIAGNOSIS — Z13220 Encounter for screening for lipoid disorders: Secondary | ICD-10-CM | POA: Diagnosis not present

## 2019-11-10 ENCOUNTER — Ambulatory Visit
Admission: RE | Admit: 2019-11-10 | Discharge: 2019-11-10 | Disposition: A | Discharge status: Home / Self Care | Payer: 59 | Source: Ambulatory Visit | Attending: Family Medicine | Admitting: Family Medicine

## 2019-11-10 ENCOUNTER — Other Ambulatory Visit: Payer: Self-pay

## 2019-11-10 DIAGNOSIS — M75121 Complete rotator cuff tear or rupture of right shoulder, not specified as traumatic: Secondary | ICD-10-CM | POA: Diagnosis not present

## 2019-11-10 DIAGNOSIS — M25511 Pain in right shoulder: Secondary | ICD-10-CM

## 2019-11-10 DIAGNOSIS — G8929 Other chronic pain: Secondary | ICD-10-CM

## 2019-11-10 LAB — CYTOLOGY - PAP
Comment: NEGATIVE
Diagnosis: NEGATIVE
High risk HPV: NEGATIVE

## 2019-11-10 MED ORDER — IOPAMIDOL (ISOVUE-M 200) INJECTION 41%
12.0000 mL | Freq: Once | INTRAMUSCULAR | Status: AC
  Administered 2019-11-10: 12 mL via INTRA_ARTICULAR

## 2019-11-11 ENCOUNTER — Encounter: Payer: Self-pay | Admitting: Family Medicine

## 2019-11-12 ENCOUNTER — Encounter: Payer: Self-pay | Admitting: Family Medicine

## 2019-11-12 NOTE — Telephone Encounter (Signed)
Patient scheduled for virtual visit tomorrow afternoon to discuss next steps with Dr. Katrinka Blazing.

## 2019-11-12 NOTE — Telephone Encounter (Signed)
Pt called about this message. Wants to know work restrictions ( is at work now), yoga restrictions AND would like to talk about the impending surgery. Advised her staff was with patients this morning.

## 2019-11-12 NOTE — Telephone Encounter (Signed)
Patient called the office stating that she has scheduled an appt with Dr. Rennis Chris at [facility] & would like the records sent over to his office. She also asked if someone could explain to her why surgery is necessary and what restrictions she should be under.

## 2019-11-13 ENCOUNTER — Ambulatory Visit (INDEPENDENT_AMBULATORY_CARE_PROVIDER_SITE_OTHER): Payer: 59 | Admitting: Family Medicine

## 2019-11-13 ENCOUNTER — Encounter: Payer: Self-pay | Admitting: Family Medicine

## 2019-11-13 DIAGNOSIS — M75111 Incomplete rotator cuff tear or rupture of right shoulder, not specified as traumatic: Secondary | ICD-10-CM

## 2019-11-13 DIAGNOSIS — M75101 Unspecified rotator cuff tear or rupture of right shoulder, not specified as traumatic: Secondary | ICD-10-CM | POA: Insufficient documentation

## 2019-11-13 MED ORDER — CELECOXIB 200 MG PO CAPS: ORAL_CAPSULE | ORAL | 2 refills | Status: DC

## 2019-11-13 NOTE — Progress Notes (Signed)
Virtual Visit via Video Note  I connected with Kaitlyn Dominguez on 11/13/19 at  4:15 PM EDT by a video enabled telemedicine application and verified that I am speaking with the correct person using two identifiers.  Location: Patient: In home setting Provider: In office setting   I discussed the limitations of evaluation and management by telemedicine and the availability of in person appointments. The patient expressed understanding and agreed to proceed.  History of Present Illness: 64 year old female with shoulder pain right side that had been diagnosed with a subdeltoid bursitis and acromioclavicular arthritis that was failing all conservative therapy and sent for an MRI of the shoulder.  MRI of the shoulder was independently visualized by me showing that patient did have a 1 cm full-thickness tear of the supraspinatus with mild retraction of 1 cm.  Also found to have moderate arthritic changes of the acromioclavicular joint that was a type II.  Mild tear of the superior labrum also noted.    Observations/Objective: Alert and oriented x3   Assessment and Plan:Patient's MRI did reveal that patient did have a full-thickness partial width rotator cuff tear of the supraspinatus with 1 cm of retraction.  Somewhat surprising with patient not having a significant labral tear.  Patient previously did have a perilabral cyst that we did ask for a one-time but now seems to have resolved.  Patient though also has a type II acromium that I think contributed to the potential rotator cuff tear and we discussed with her about different treatment options.  Patient has failed formal physical therapy, oral anti-inflammatories, topical anti-inflammatories as well as steroid injections in both this and the moderate arthritic AC joint.  I believe the patient would need surgical intervention at this point.  Will be referred to Dr. Ave Filter spent greater than 45 minutes discussing with patient and looking over her  imaging.    Follow Up Instructions: as needed     I discussed the assessment and treatment plan with the patient. The patient was provided an opportunity to ask questions and all were answered. The patient agreed with the plan and demonstrated an understanding of the instructions.   The patient was advised to call back or seek an in-person evaluation if the symptoms worsen or if the condition fails to improve as anticipated.  I provided 45 minutes of -face-to-face in review of patient's imaging, previous office notes, x-rays.  Time during this encounter.   Judi Saa, DO

## 2019-11-13 NOTE — Assessment & Plan Note (Signed)
Patient's MRI did reveal that patient did have a full-thickness partial width rotator cuff tear of the supraspinatus with 1 cm of retraction.  Somewhat surprising with patient not having a significant labral tear.  Patient previously did have a perilabral cyst that we did ask for a one-time but now seems to have resolved.  Patient though also has a type II acromium that I think contributed to the potential rotator cuff tear and we discussed with her about different treatment options.  Patient has failed formal physical therapy, oral anti-inflammatories, topical anti-inflammatories as well as steroid injections in both this and the moderate arthritic AC joint.  I believe the patient would need surgical intervention at this point.  Will be referred to Dr. Ave Filter spent greater than 45 minutes discussing with patient and looking over her imaging.

## 2019-11-17 ENCOUNTER — Other Ambulatory Visit: Payer: Self-pay

## 2019-11-17 DIAGNOSIS — G8929 Other chronic pain: Secondary | ICD-10-CM

## 2019-11-17 NOTE — Progress Notes (Signed)
Order has been sent

## 2019-11-20 DIAGNOSIS — R69 Illness, unspecified: Secondary | ICD-10-CM | POA: Diagnosis not present

## 2019-11-24 ENCOUNTER — Other Ambulatory Visit: Payer: Self-pay | Admitting: Orthopedic Surgery

## 2019-11-24 DIAGNOSIS — M24811 Other specific joint derangements of right shoulder, not elsewhere classified: Secondary | ICD-10-CM | POA: Diagnosis not present

## 2019-11-24 DIAGNOSIS — S46011A Strain of muscle(s) and tendon(s) of the rotator cuff of right shoulder, initial encounter: Secondary | ICD-10-CM | POA: Diagnosis not present

## 2019-11-24 DIAGNOSIS — M75121 Complete rotator cuff tear or rupture of right shoulder, not specified as traumatic: Secondary | ICD-10-CM | POA: Diagnosis not present

## 2019-11-25 ENCOUNTER — Encounter (HOSPITAL_BASED_OUTPATIENT_CLINIC_OR_DEPARTMENT_OTHER): Payer: Self-pay | Admitting: Orthopedic Surgery

## 2019-11-25 ENCOUNTER — Other Ambulatory Visit: Payer: Self-pay

## 2019-11-27 ENCOUNTER — Other Ambulatory Visit (HOSPITAL_COMMUNITY): Payer: 59 | Attending: Orthopedic Surgery

## 2019-11-27 NOTE — Progress Notes (Signed)

## 2019-11-28 ENCOUNTER — Other Ambulatory Visit (HOSPITAL_COMMUNITY)
Admission: RE | Admit: 2019-11-28 | Discharge: 2019-11-28 | Disposition: A | Discharge status: Home / Self Care | Payer: 59 | Source: Ambulatory Visit | Attending: Orthopedic Surgery | Admitting: Orthopedic Surgery

## 2019-11-28 DIAGNOSIS — Z01812 Encounter for preprocedural laboratory examination: Secondary | ICD-10-CM | POA: Diagnosis not present

## 2019-11-28 DIAGNOSIS — Z20822 Contact with and (suspected) exposure to covid-19: Secondary | ICD-10-CM | POA: Insufficient documentation

## 2019-11-28 LAB — SARS CORONAVIRUS 2 (TAT 6-24 HRS): SARS Coronavirus 2: NEGATIVE

## 2019-12-01 ENCOUNTER — Ambulatory Visit (HOSPITAL_BASED_OUTPATIENT_CLINIC_OR_DEPARTMENT_OTHER): Payer: 59 | Admitting: Certified Registered"

## 2019-12-01 ENCOUNTER — Other Ambulatory Visit: Payer: Self-pay

## 2019-12-01 ENCOUNTER — Encounter (HOSPITAL_BASED_OUTPATIENT_CLINIC_OR_DEPARTMENT_OTHER)
Admission: RE | Disposition: A | Discharge status: Home / Self Care | Payer: Self-pay | Source: Home / Self Care | Attending: Orthopedic Surgery

## 2019-12-01 ENCOUNTER — Ambulatory Visit (HOSPITAL_BASED_OUTPATIENT_CLINIC_OR_DEPARTMENT_OTHER)
Admission: RE | Admit: 2019-12-01 | Discharge: 2019-12-01 | Disposition: A | Discharge status: Home / Self Care | Payer: 59 | Attending: Orthopedic Surgery | Admitting: Orthopedic Surgery

## 2019-12-01 ENCOUNTER — Encounter (HOSPITAL_BASED_OUTPATIENT_CLINIC_OR_DEPARTMENT_OTHER): Payer: Self-pay | Admitting: Orthopedic Surgery

## 2019-12-01 DIAGNOSIS — Z791 Long term (current) use of non-steroidal anti-inflammatories (NSAID): Secondary | ICD-10-CM | POA: Diagnosis not present

## 2019-12-01 DIAGNOSIS — M19011 Primary osteoarthritis, right shoulder: Secondary | ICD-10-CM | POA: Insufficient documentation

## 2019-12-01 DIAGNOSIS — M75121 Complete rotator cuff tear or rupture of right shoulder, not specified as traumatic: Secondary | ICD-10-CM | POA: Diagnosis not present

## 2019-12-01 DIAGNOSIS — Z79899 Other long term (current) drug therapy: Secondary | ICD-10-CM | POA: Insufficient documentation

## 2019-12-01 DIAGNOSIS — R69 Illness, unspecified: Secondary | ICD-10-CM | POA: Diagnosis not present

## 2019-12-01 DIAGNOSIS — M7541 Impingement syndrome of right shoulder: Secondary | ICD-10-CM | POA: Diagnosis not present

## 2019-12-01 DIAGNOSIS — M25811 Other specified joint disorders, right shoulder: Secondary | ICD-10-CM | POA: Diagnosis not present

## 2019-12-01 DIAGNOSIS — M75101 Unspecified rotator cuff tear or rupture of right shoulder, not specified as traumatic: Secondary | ICD-10-CM | POA: Diagnosis not present

## 2019-12-01 DIAGNOSIS — F419 Anxiety disorder, unspecified: Secondary | ICD-10-CM | POA: Diagnosis not present

## 2019-12-01 DIAGNOSIS — G8918 Other acute postprocedural pain: Secondary | ICD-10-CM | POA: Diagnosis not present

## 2019-12-01 HISTORY — DX: Nausea with vomiting, unspecified: R11.2

## 2019-12-01 HISTORY — DX: Other specified postprocedural states: Z98.890

## 2019-12-01 HISTORY — PX: SHOULDER ARTHROSCOPY WITH ROTATOR CUFF REPAIR AND SUBACROMIAL DECOMPRESSION: SHX5686

## 2019-12-01 HISTORY — PX: SUBACROMIAL DECOMPRESSION: SHX5174

## 2019-12-01 SURGERY — SHOULDER ARTHROSCOPY WITH ROTATOR CUFF REPAIR AND SUBACROMIAL DECOMPRESSION
Anesthesia: Regional | Site: Shoulder | Laterality: Right

## 2019-12-01 MED ORDER — PROPOFOL 10 MG/ML IV BOLUS
INTRAVENOUS | Status: AC
  Filled 2019-12-01: qty 20

## 2019-12-01 MED ORDER — BUPIVACAINE LIPOSOME 1.3 % IJ SUSP
INTRAMUSCULAR | Status: DC | PRN
  Administered 2019-12-01: 10 mL via PERINEURAL

## 2019-12-01 MED ORDER — MIDAZOLAM HCL 2 MG/2ML IJ SOLN
1.0000 mg | INTRAMUSCULAR | Status: DC | PRN
  Administered 2019-12-01: 1 mg via INTRAVENOUS

## 2019-12-01 MED ORDER — PROPOFOL 10 MG/ML IV BOLUS
INTRAVENOUS | Status: DC | PRN
  Administered 2019-12-01: 200 mg via INTRAVENOUS
  Administered 2019-12-01: 50 mg via INTRAVENOUS

## 2019-12-01 MED ORDER — MIDAZOLAM HCL 2 MG/2ML IJ SOLN
INTRAMUSCULAR | Status: AC
  Filled 2019-12-01: qty 2

## 2019-12-01 MED ORDER — LIDOCAINE 2% (20 MG/ML) 5 ML SYRINGE
INTRAMUSCULAR | Status: DC | PRN
  Administered 2019-12-01: 20 mg via INTRAVENOUS

## 2019-12-01 MED ORDER — DEXAMETHASONE SODIUM PHOSPHATE 10 MG/ML IJ SOLN
INTRAMUSCULAR | Status: DC | PRN
  Administered 2019-12-01: 10 mg via INTRAVENOUS

## 2019-12-01 MED ORDER — PROPOFOL 500 MG/50ML IV EMUL
INTRAVENOUS | Status: DC | PRN
  Administered 2019-12-01 (×2): 175 ug/kg/min via INTRAVENOUS

## 2019-12-01 MED ORDER — ONDANSETRON HCL 4 MG/2ML IJ SOLN: 4.0000 mg | Freq: Four times a day (QID) | INTRAMUSCULAR | Status: DC | PRN

## 2019-12-01 MED ORDER — LACTATED RINGERS IV SOLN: INTRAVENOUS | Status: DC

## 2019-12-01 MED ORDER — PROPOFOL 500 MG/50ML IV EMUL
INTRAVENOUS | Status: AC
  Filled 2019-12-01: qty 100

## 2019-12-01 MED ORDER — ONDANSETRON HCL 4 MG/2ML IJ SOLN
INTRAMUSCULAR | Status: DC | PRN
  Administered 2019-12-01: 4 mg via INTRAVENOUS

## 2019-12-01 MED ORDER — OXYCODONE HCL 5 MG/5ML PO SOLN: 5.0000 mg | Freq: Once | ORAL | Status: DC | PRN

## 2019-12-01 MED ORDER — EPHEDRINE 5 MG/ML INJ
INTRAVENOUS | Status: AC
  Filled 2019-12-01: qty 20

## 2019-12-01 MED ORDER — BUPIVACAINE HCL (PF) 0.5 % IJ SOLN
INTRAMUSCULAR | Status: DC | PRN
  Administered 2019-12-01: 15 mL via PERINEURAL

## 2019-12-01 MED ORDER — ROCURONIUM BROMIDE 10 MG/ML (PF) SYRINGE
PREFILLED_SYRINGE | INTRAVENOUS | Status: AC
  Filled 2019-12-01: qty 10

## 2019-12-01 MED ORDER — SCOPOLAMINE 1 MG/3DAYS TD PT72
1.0000 | MEDICATED_PATCH | TRANSDERMAL | Status: DC
  Administered 2019-12-01: 1.5 mg via TRANSDERMAL

## 2019-12-01 MED ORDER — FENTANYL CITRATE (PF) 100 MCG/2ML IJ SOLN: 25.0000 ug | INTRAMUSCULAR | Status: DC | PRN

## 2019-12-01 MED ORDER — FENTANYL CITRATE (PF) 100 MCG/2ML IJ SOLN
INTRAMUSCULAR | Status: AC
  Filled 2019-12-01: qty 2

## 2019-12-01 MED ORDER — ROCURONIUM BROMIDE 100 MG/10ML IV SOLN
INTRAVENOUS | Status: DC | PRN
  Administered 2019-12-01: 50 mg via INTRAVENOUS

## 2019-12-01 MED ORDER — VANCOMYCIN HCL IN DEXTROSE 1-5 GM/200ML-% IV SOLN
INTRAVENOUS | Status: AC
  Filled 2019-12-01: qty 200

## 2019-12-01 MED ORDER — SUGAMMADEX SODIUM 200 MG/2ML IV SOLN
INTRAVENOUS | Status: DC | PRN
  Administered 2019-12-01: 150 mg via INTRAVENOUS

## 2019-12-01 MED ORDER — SCOPOLAMINE 1 MG/3DAYS TD PT72
MEDICATED_PATCH | TRANSDERMAL | Status: AC
  Filled 2019-12-01: qty 1

## 2019-12-01 MED ORDER — FENTANYL CITRATE (PF) 100 MCG/2ML IJ SOLN
50.0000 ug | INTRAMUSCULAR | Status: DC | PRN
  Administered 2019-12-01: 50 ug via INTRAVENOUS

## 2019-12-01 MED ORDER — EPHEDRINE SULFATE 50 MG/ML IJ SOLN
INTRAMUSCULAR | Status: DC | PRN
  Administered 2019-12-01 (×4): 10 mg via INTRAVENOUS

## 2019-12-01 MED ORDER — OXYCODONE HCL 5 MG PO TABS: 5.0000 mg | ORAL_TABLET | Freq: Once | ORAL | Status: DC | PRN

## 2019-12-01 MED ORDER — FENTANYL CITRATE (PF) 100 MCG/2ML IJ SOLN
INTRAMUSCULAR | Status: DC | PRN
  Administered 2019-12-01: 50 ug via INTRAVENOUS
  Administered 2019-12-01 (×2): 25 ug via INTRAVENOUS

## 2019-12-01 MED ORDER — VANCOMYCIN HCL IN DEXTROSE 1-5 GM/200ML-% IV SOLN
1000.0000 mg | INTRAVENOUS | Status: AC
  Administered 2019-12-01 (×2): 1000 mg via INTRAVENOUS

## 2019-12-01 SURGICAL SUPPLY — 61 items
ADH SKN CLS APL DERMABOND .7 (GAUZE/BANDAGES/DRESSINGS)
AID PSTN UNV HD RSTRNT DISP (MISCELLANEOUS) ×2
ANCH SUT SWLK 19.1X4.75 VT (Anchor) ×8 IMPLANT
ANCHOR PEEK 4.75X19.1 SWLK C (Anchor) ×8 IMPLANT
APL PRP STRL LF DISP 70% ISPRP (MISCELLANEOUS) ×2
BURR OVAL 8 FLU 4.0X13 (MISCELLANEOUS) ×3 IMPLANT
CANNULA 5.75X7 CRYSTAL CLEAR (CANNULA) ×3 IMPLANT
CANNULA TWIST IN 8.25X7CM (CANNULA) ×2 IMPLANT
CHLORAPREP W/TINT 26 (MISCELLANEOUS) ×3 IMPLANT
COVER WAND RF STERILE (DRAPES) IMPLANT
CUTTER BONE 4.0MM X 13CM (MISCELLANEOUS) ×3 IMPLANT
DECANTER SPIKE VIAL GLASS SM (MISCELLANEOUS) IMPLANT
DERMABOND ADVANCED (GAUZE/BANDAGES/DRESSINGS)
DERMABOND ADVANCED .7 DNX12 (GAUZE/BANDAGES/DRESSINGS) IMPLANT
DRAPE IMP U-DRAPE 54X76 (DRAPES) ×3 IMPLANT
DRAPE INCISE IOBAN 66X45 STRL (DRAPES) ×5 IMPLANT
DRAPE STERI 35X30 U-POUCH (DRAPES) ×5 IMPLANT
DRAPE SURG 17X23 STRL (DRAPES) ×3 IMPLANT
DRAPE U-SHAPE 47X51 STRL (DRAPES) ×3 IMPLANT
DRAPE U-SHAPE 76X120 STRL (DRAPES) ×6 IMPLANT
DRSG PAD ABDOMINAL 8X10 ST (GAUZE/BANDAGES/DRESSINGS) ×3 IMPLANT
GAUZE 4X4 16PLY RFD (DISPOSABLE) IMPLANT
GAUZE SPONGE 4X4 12PLY STRL (GAUZE/BANDAGES/DRESSINGS) ×3 IMPLANT
GAUZE XEROFORM 1X8 LF (GAUZE/BANDAGES/DRESSINGS) ×3 IMPLANT
GLOVE BIO SURGEON STRL SZ7 (GLOVE) ×3 IMPLANT
GLOVE BIO SURGEON STRL SZ7.5 (GLOVE) ×3 IMPLANT
GLOVE BIOGEL PI IND STRL 7.0 (GLOVE) ×3 IMPLANT
GLOVE BIOGEL PI IND STRL 8 (GLOVE) ×2 IMPLANT
GLOVE BIOGEL PI INDICATOR 7.0 (GLOVE) ×2
GLOVE BIOGEL PI INDICATOR 8 (GLOVE) ×1
GLOVE ECLIPSE 6.5 STRL STRAW (GLOVE) ×2 IMPLANT
GOWN STRL REUS W/ TWL LRG LVL3 (GOWN DISPOSABLE) ×4 IMPLANT
GOWN STRL REUS W/ TWL XL LVL3 (GOWN DISPOSABLE) ×2 IMPLANT
GOWN STRL REUS W/TWL LRG LVL3 (GOWN DISPOSABLE) ×6
GOWN STRL REUS W/TWL XL LVL3 (GOWN DISPOSABLE) ×3
LASSO CRESCENT QUICKPASS (SUTURE) IMPLANT
MANIFOLD NEPTUNE II (INSTRUMENTS) ×3 IMPLANT
NDL SCORPION MULTI FIRE (NEEDLE) IMPLANT
NEEDLE SCORPION MULTI FIRE (NEEDLE) ×3 IMPLANT
NS IRRIG 1000ML POUR BTL (IV SOLUTION) IMPLANT
PACK ARTHROSCOPY DSU (CUSTOM PROCEDURE TRAY) ×3 IMPLANT
PACK BASIN DAY SURGERY FS (CUSTOM PROCEDURE TRAY) ×3 IMPLANT
PROBE BIPOLAR ATHRO 135MM 90D (MISCELLANEOUS) ×3 IMPLANT
RESTRAINT HEAD UNIVERSAL NS (MISCELLANEOUS) ×3 IMPLANT
SLEEVE SCD COMPRESS KNEE MED (MISCELLANEOUS) ×3 IMPLANT
SLING ARM FOAM STRAP LRG (SOFTGOODS) ×2 IMPLANT
SPONGE LAP 4X18 RFD (DISPOSABLE) IMPLANT
SUCTION FRAZIER HANDLE 10FR (MISCELLANEOUS)
SUCTION TUBE FRAZIER 10FR DISP (MISCELLANEOUS) IMPLANT
SUPPORT WRAP ARM LG (MISCELLANEOUS) ×2 IMPLANT
SUT ETHILON 3 0 PS 1 (SUTURE) ×3 IMPLANT
SUT FIBERWIRE #2 38 T-5 BLUE (SUTURE)
SUT PDS AB 0 CT 36 (SUTURE) IMPLANT
SUT TIGER TAPE 7 IN WHITE (SUTURE) ×2 IMPLANT
SUTURE FIBERWR #2 38 T-5 BLUE (SUTURE) IMPLANT
SYR BULB 3OZ (MISCELLANEOUS) IMPLANT
TAPE FIBER 2MM 7IN #2 BLUE (SUTURE) ×2 IMPLANT
TOWEL GREEN STERILE FF (TOWEL DISPOSABLE) ×3 IMPLANT
TUBE CONNECTING 20X1/4 (TUBING) ×3 IMPLANT
TUBING ARTHROSCOPY IRRIG 16FT (MISCELLANEOUS) ×3 IMPLANT
WATER STERILE IRR 1000ML POUR (IV SOLUTION) ×3 IMPLANT

## 2019-12-01 NOTE — Anesthesia Procedure Notes (Signed)
Procedure Name: Intubation Date/Time: 12/01/2019 3:07 PM Performed by: Lauralyn Primes, CRNA Pre-anesthesia Checklist: Patient identified, Emergency Drugs available, Suction available and Patient being monitored Patient Re-evaluated:Patient Re-evaluated prior to induction Oxygen Delivery Method: Circle system utilized Preoxygenation: Pre-oxygenation with 100% oxygen Induction Type: IV induction Ventilation: Mask ventilation without difficulty Laryngoscope Size: Miller and 2 Grade View: Grade I Tube type: Oral Tube size: 7.0 mm Number of attempts: 1 Airway Equipment and Method: Stylet and Oral airway Placement Confirmation: ETT inserted through vocal cords under direct vision,  positive ETCO2 and breath sounds checked- equal and bilateral Secured at: 23 cm Tube secured with: Tape Dental Injury: Teeth and Oropharynx as per pre-operative assessment

## 2019-12-01 NOTE — H&P (Signed)
Kaitlyn Dominguez is an 64 y.o. female.   Chief Complaint: R shoulder pain and dysfunction HPI: R shoulder rotator cuff tear, AC arthropathy. Failed conservative management.  Past Medical History:  Diagnosis Date  . Anxiety   . Osteoarthritis   . PONV (postoperative nausea and vomiting)    had emesis after surgery when went home  . Serum calcium elevated   . Vaginal atrophy     Past Surgical History:  Procedure Laterality Date  . COLONOSCOPY    . FOOT SURGERY     left; on multiple occasions  . KNEE ARTHROSCOPY  2001, 2005   left x 2    Family History  Problem Relation Age of Onset  . Heart attack Mother   . Heart disease Maternal Grandmother   . Alzheimer's disease Father   . Colon cancer Neg Hx   . Esophageal cancer Neg Hx   . Rectal cancer Neg Hx   . Stomach cancer Neg Hx    Social History:  reports that she has never smoked. She has never used smokeless tobacco. She reports current alcohol use. She reports that she does not use drugs.  Allergies:  Allergies  Allergen Reactions  . Penicillins     REACTION: hives    Medications Prior to Admission  Medication Sig Dispense Refill  . celecoxib (CELEBREX) 200 MG capsule One to 2 tablets by mouth daily as needed for pain. 60 capsule 2  . ibuprofen (ADVIL,MOTRIN) 200 MG tablet Take 200 mg by mouth every 6 (six) hours as needed.    Marland Kitchen LORazepam (ATIVAN) 0.5 MG tablet Take 0.5 mg 2 (two) times daily as needed by mouth.  3  . Omega-3 Fatty Acids (FISH OIL PO) Take by mouth.    . polyethylene glycol powder (GLYCOLAX/MIRALAX) powder Take 17 g by mouth daily. 255 g 3  . venlafaxine (EFFEXOR) 37.5 MG tablet Take 37.5 mg by mouth daily.    . Vitamin D, Ergocalciferol, (DRISDOL) 1.25 MG (50000 UNIT) CAPS capsule Take 1 capsule (50,000 Units total) by mouth every 7 (seven) days. 12 capsule 0  . Diclofenac Sodium (PENNSAID) 2 % SOLN Place 2 g onto the skin 2 (two) times daily. 112 g 3    No results found for this or any previous  visit (from the past 48 hour(s)). No results found.  Review of Systems  All other systems reviewed and are negative.   Blood pressure 99/64, pulse (!) 55, temperature 97.6 F (36.4 C), temperature source Tympanic, resp. rate 14, height 5\' 7"  (1.702 m), weight 62.3 kg, SpO2 100 %. Physical Exam  Constitutional: She is oriented to person, place, and time. She appears well-developed and well-nourished.  HENT:  Head: Atraumatic.  Eyes: EOM are normal.  Cardiovascular: Intact distal pulses.  Respiratory: Effort normal.  Musculoskeletal:     Comments: R shoulder pain with RC testing. TTP at [facility] joint.  Neurological: She is alert and oriented to person, place, and time.  Skin: Skin is warm and dry.  Psychiatric: She has a normal mood and affect.     Assessment/Plan R shoulder RCT, AC DJD Plan R arth RCR, SAD, DCE Risks / benefits of surgery discussed Consent on chart  NPO for OR Preop antibiotics   [facility], MD 12/01/2019, 2:55 PM

## 2019-12-01 NOTE — Discharge Instructions (Addendum)
Discharge Instructions after Arthroscopic Shoulder Repair   A sling has been provided for you. Remain in your sling at all times. This includes sleeping in your sling.  Use ice on the shoulder intermittently over the first 48 hours after surgery.  Pain medicine has been prescribed for you.  Use your medicine liberally over the first 48 hours, and then you can begin to taper your use. You may take Extra Strength Tylenol or Tylenol only in place of the pain pills. DO NOT take ANY nonsteroidal anti-inflammatory pain medications: Advil, Motrin, Ibuprofen, Aleve, Naproxen, or Narprosyn.  You may remove your dressing after two days. If the incision sites are still moist, place a Band-Aid over the moist site(s). Change Band-Aids daily until dry.  You may shower 5 days after surgery. The incisions CANNOT get wet prior to 5 days. Simply allow the water to wash over the site and then pat dry. Do not rub the incisions. Make sure your axilla (armpit) is completely dry after showering.  Take one aspirin a day for 2 weeks after surgery, unless you have an aspirin sensitivity/ allergy or asthma.   Please call [phone] during normal business hours or [phone] after hours for any problems. Including the following:  - excessive redness of the incisions - drainage for more than 4 days - fever of more than 101.5 F  *Please note that pain medications will not be refilled after hours or on weekends.   Post Anesthesia Home Care Instructions  Activity: Get plenty of rest for the remainder of the day. A responsible individual must stay with you for 24 hours following the procedure.  For the next 24 hours, DO NOT: -Drive a car -Operate machinery -Drink alcoholic beverages -Take any medication unless instructed by your physician -Make any legal decisions or sign important papers.  Meals: Start with liquid foods such as gelatin or soup. Progress to regular foods as tolerated. Avoid greasy, spicy, heavy  foods. If nausea and/or vomiting occur, drink only clear liquids until the nausea and/or vomiting subsides. Call your physician if vomiting continues.  Special Instructions/Symptoms: Your throat may feel dry or sore from the anesthesia or the breathing tube placed in your throat during surgery. If this causes discomfort, gargle with warm salt water. The discomfort should disappear within 24 hours.  If you had a scopolamine patch placed behind your ear for the management of post- operative nausea and/or vomiting:  1. The medication in the patch is effective for 72 hours, after which it should be removed.  Wrap patch in a tissue and discard in the trash. Wash hands thoroughly with soap and water. 2. You may remove the patch earlier than 72 hours if you experience unpleasant side effects which may include dry mouth, dizziness or visual disturbances. 3. Avoid touching the patch. Wash your hands with soap and water after contact with the patch.     Regional Anesthesia Blocks  1. Numbness or the inability to move the "blocked" extremity may last from 3-48 hours after placement. The length of time depends on the medication injected and your individual response to the medication. If the numbness is not going away after 48 hours, call your surgeon.  2. The extremity that is blocked will need to be protected until the numbness is gone and the  Strength has returned. Because you cannot feel it, you will need to take extra care to avoid injury. Because it may be weak, you may have difficulty moving it or using it. You may   not know what position it is in without looking at it while the block is in effect.  3. For blocks in the legs and feet, returning to weight bearing and walking needs to be done carefully. You will need to wait until the numbness is entirely gone and the strength has returned. You should be able to move your leg and foot normally before you try and bear weight or walk. You will need someone  to be with you when you first try to ensure you do not fall and possibly risk injury.  4. Bruising and tenderness at the needle site are common side effects and will resolve in a few days.  5. Persistent numbness or new problems with movement should be communicated to the surgeon or the [facility] ([phone])/ [facility] ([phone]).  Information for Discharge Teaching: EXPAREL (bupivacaine liposome injectable suspension)   Your surgeon or anesthesiologist gave you EXPAREL(bupivacaine) to help control your pain after surgery.   EXPAREL is a local anesthetic that provides pain relief by numbing the tissue around the surgical site.  EXPAREL is designed to release pain medication over time and can control pain for up to 72 hours.  Depending on how you respond to EXPAREL, you may require less pain medication during your recovery.  Possible side effects:  Temporary loss of sensation or ability to move in the area where bupivacaine was injected.  Nausea, vomiting, constipation  Rarely, numbness and tingling in your mouth or lips, lightheadedness, or anxiety may occur.  Call your doctor right away if you think you may be experiencing any of these sensations, or if you have other questions regarding possible side effects.  Follow all other discharge instructions given to you by your surgeon or nurse. Eat a healthy diet and drink plenty of water or other fluids.  If you return to the hospital for any reason within 96 hours following the administration of EXPAREL, it is important for health care providers to know that you have received this anesthetic. A teal colored band has been placed on your arm with the date, time and amount of EXPAREL you have received in order to alert and inform your health care providers. Please leave this armband in place for the full 96 hours following administration, and then you may remove the band.    

## 2019-12-01 NOTE — Transfer of Care (Signed)
Immediate Anesthesia Transfer of Care Note  Patient: Kaitlyn Dominguez  Procedure(s) Performed: SHOULDER ARTHROSCOPY WITH ROTATOR CUFF REPAIR, DISTAL CLAVICLE EXCISION (Right Shoulder) SUBACROMIAL DECOMPRESSION (Right Shoulder)  Patient Location: PACU  Anesthesia Type:GA combined with regional for post-op pain  Level of Consciousness: awake, alert  and oriented  Airway & Oxygen Therapy: Patient Spontanous Breathing and Patient connected to face mask oxygen  Post-op Assessment: Report given to RN and Post -op Vital signs reviewed and stable  Post vital signs: Reviewed and stable  Last Vitals:  Vitals Value Taken Time  BP 112/62 12/01/19 1653  Temp    Pulse 73 12/01/19 1656  Resp 12 12/01/19 1656  SpO2 100 % 12/01/19 1656  Vitals shown include unvalidated device data.  Last Pain:  Vitals:   12/01/19 1400  TempSrc:   PainSc: 0-No pain         Complications: No apparent anesthesia complications

## 2019-12-01 NOTE — Anesthesia Preprocedure Evaluation (Addendum)
Anesthesia Evaluation  Patient identified by MRN, date of birth, ID band Patient awake    Reviewed: Allergy & Precautions, H&P , NPO status , Patient's Chart, lab work & pertinent test results  History of Anesthesia Complications (+) PONV and history of anesthetic complications  Airway Mallampati: II   Neck ROM: full    Dental  (+) Teeth Intact, Dental Advisory Given   Pulmonary neg pulmonary ROS,    breath sounds clear to auscultation       Cardiovascular negative cardio ROS   Rhythm:regular Rate:Normal     Neuro/Psych PSYCHIATRIC DISORDERS Anxiety negative neurological ROS     GI/Hepatic negative GI ROS, Neg liver ROS,   Endo/Other  negative endocrine ROS  Renal/GU negative Renal ROS  negative genitourinary   Musculoskeletal  (+) Arthritis , Osteoarthritis,  Right shoulder rotator cuff tear, AC osteoarthritis    Abdominal Normal abdominal exam  (+)   Peds  Hematology negative hematology ROS (+)   Anesthesia Other Findings   Reproductive/Obstetrics negative OB ROS                           Anesthesia Physical Anesthesia Plan  ASA: II  Anesthesia Plan: General and Regional   Post-op Pain Management: GA combined w/ Regional for post-op pain   Induction: Intravenous  PONV Risk Score and Plan: 4 or greater and Ondansetron, Dexamethasone, Midazolam, Scopolamine patch - Pre-op, Treatment may vary due to age or medical condition, TIVA, Propofol infusion and Diphenhydramine  Airway Management Planned: Oral ETT  Additional Equipment:   Intra-op Plan:   Post-operative Plan: Extubation in OR  Informed Consent: I have reviewed the patients History and Physical, chart, labs and discussed the procedure including the risks, benefits and alternatives for the proposed anesthesia with the patient or authorized representative who has indicated his/her understanding and acceptance.      Dental advisory given  Plan Discussed with: CRNA, Anesthesiologist and Surgeon  Anesthesia Plan Comments:      Anesthesia Quick Evaluation

## 2019-12-01 NOTE — Anesthesia Procedure Notes (Signed)
Anesthesia Regional Block: Interscalene brachial plexus block   Pre-Anesthetic Checklist: ,, timeout performed, Correct Patient, Correct Site, Correct Laterality, Correct Procedure, Correct Position, site marked, Risks and benefits discussed,  Surgical consent,  Pre-op evaluation,  At surgeon's request and post-op pain management  Laterality: Right  Prep: chloraprep       Needles:  Injection technique: Single-shot  Needle Type: Echogenic Stimulator Needle     Needle Length: 5cm  Needle Gauge: 22     Additional Needles:   Procedures:, nerve stimulator,,,,,,,   Nerve Stimulator or Paresthesia:  Response: biceps flexion, 0.45 mA,   Additional Responses:   Narrative:  Start time: 12/01/2019 1:30 PM End time: 12/01/2019 1:38 PM Injection made incrementally with aspirations every 5 mL.  Performed by: Personally  Anesthesiologist: Achille Rich, MD  Additional Notes: Functioning IV was confirmed and monitors were applied.  A 70mm 22ga Arrow echogenic stimulator needle was used. Sterile prep and drape,hand hygiene and sterile gloves were used.  Negative aspiration and negative test dose prior to incremental administration of local anesthetic. The patient tolerated the procedure well.  Ultrasound guidance: relevent anatomy identified, needle position confirmed, local anesthetic spread visualized around nerve(s), vascular puncture avoided.  Image printed for medical record.

## 2019-12-01 NOTE — Progress Notes (Signed)
Assisted Dr. Hodierne with right, ultrasound guided, interscalene  block. Side rails up, monitors on throughout procedure. See vital signs in flow sheet. Tolerated Procedure well. 

## 2019-12-01 NOTE — Op Note (Signed)
Procedure(s):  Procedure Note  Kaitlyn Dominguez female 64 y.o. 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  Postoperative diagnosis: Same  Procedure(s) and Anesthesia Type: #1 right shoulder arthroscopic rotator cuff repair #2 right shoulder arthroscopic subacromial decompression #3 right shoulder arthroscopic distal clavicle excision  Surgeon(s) and Role:    Tania Ade, MD - Primary     Surgeon: Isabella Stalling   Assistants: None  Anesthesia: General endotracheal anesthesia with preoperative interscalene block given by the attending anesthesiologist   Procedure Detail    Estimated Blood Loss: Min         Drains: none  Blood Given: none         Specimens: none        Complications:  * No complications entered in OR log *         Disposition: PACU - hemodynamically stable.         Condition: stable    Procedure:   INDICATIONS FOR SURGERY: The patient is 64 y.o. female who has had a long history of right shoulder pain which is failed conservative management.  She was found to have full-thickness rotator cuff tearing by MRI as well as unfavorable acromial anatomy and AC joint arthropathy and failed conservative management.  OPERATIVE FINDINGS: Examination under anesthesia: No stiffness or instability   DESCRIPTION OF PROCEDURE: The patient was identified in preoperative  holding area where I personally marked the operative site after  verifying site, side, and procedure with the patient. An interscalene block was given by the attending anesthesiologist the holding area.  The patient was taken back to the operating room where general anesthesia was induced without complication and was placed in the beach-chair position with the back  elevated about 60 degrees and all extremities and head and neck  carefully padded and  positioned.   The right upper extremity was then prepped and  draped in a standard sterile fashion. The appropriate time-out  procedure was carried out. The patient did receive IV antibiotics  within 30 minutes of incision.   A small posterior portal incision was made and the arthroscope was introduced into the joint. An anterior portal was then established above the subscapularis using needle localization. Small cannula was placed anteriorly. Diagnostic arthroscopy was then carried out .  The subscapularis was noted to be completely intact.  There was some synovitis in the front of the joint which was debrided.  Glenohumeral joint surfaces were intact without chondromalacia.  The biceps tendon and superior labrum looked good.  She did have full-thickness tearing of the supraspinatus involving the entire tendinous insertion.  The infraspinatus was intact.  The supraspinatus tear was debrided from the undersurface.  The arthroscope was then introduced into the subacromial space a standard lateral portal was established with needle localization. The shaver was used through the lateral portal to perform extensive bursectomy. Coracoacromial ligament was examined and found to be frayed indicating chronic impingement.  The bursal surface of the rotator cuff tear was identified and debrided back to healthy tendon.  The tuberosity was debrided down to bleeding bony surface to promote healing.  The large cannula was placed laterally and the camera was moved to a posterior lateral viewing portal.  At this point the repair was carried out by first placing 2 4.75 peek swivel lock anchors just off the articular margin anterior and posterior in the tear.  These were preloaded with fiber tape.  These 4 suture strands were then passed evenly throughout the tear anterior to posterior.  There were then brought over in a crossing suture bridge pattern to 2 additional swivel lock anchors bringing the  tendon down nicely over the prepared tuberosities.  The repair is felt to be complete and watertight.  The coracoacromial ligament was taken down off the anterior acromion with the ArthroCare exposing a moderate hooked anterior acromial spur. A high-speed bur was then used through the lateral portal to take down the anterior acromial spur from lateral to medial in a standard acromioplasty.  The acromioplasty was also viewed from the lateral portal and the bur was used as necessary to ensure that the acromion was completely flat from posterior to anterior.  The distal clavicle was exposed arthroscopically and the bur was used to take off the undersurface for approximately 8 mm from the lateral portal. The bur was then moved to an anterior portal position to complete the distal clavicle excision resecting about 8 mm of the distal clavicle and a smooth even fashion. This was viewed from anterior and lateral portals and felt to be complete.  The arthroscopic equipment was removed from the joint and the portals were closed with 3-0 nylon in an interrupted fashion. Sterile dressings were then applied including Xeroform 4 x 4's ABDs and tape. The patient was then allowed to awaken from general anesthesia, placed in a sling, transferred to the stretcher and taken to the recovery room in stable condition.   POSTOPERATIVE PLAN: The patient will be discharged home today and will followup in one week for suture removal and wound check.  She will follow the standard cuff protocol.

## 2019-12-02 ENCOUNTER — Encounter: Payer: Self-pay | Admitting: *Deleted

## 2019-12-02 NOTE — Anesthesia Postprocedure Evaluation (Signed)
Anesthesia Post Note  Patient: Kaitlyn Dominguez  Procedure(s) Performed: SHOULDER ARTHROSCOPY WITH ROTATOR CUFF REPAIR, DISTAL CLAVICLE EXCISION (Right Shoulder) SUBACROMIAL DECOMPRESSION (Right Shoulder)     Patient location during evaluation: PACU Anesthesia Type: Regional and General Level of consciousness: awake and alert Pain management: pain level controlled Vital Signs Assessment: post-procedure vital signs reviewed and stable Respiratory status: spontaneous breathing, nonlabored ventilation, respiratory function stable and patient connected to nasal cannula oxygen Cardiovascular status: blood pressure returned to baseline and stable Postop Assessment: no apparent nausea or vomiting Anesthetic complications: no    Last Vitals:  Vitals:   12/01/19 1715 12/01/19 1740  BP: 115/72 120/65  Pulse: 73 74  Resp: 13 18  Temp:  36.5 C  SpO2: 97% 98%    Last Pain:  Vitals:   12/01/19 1740  TempSrc:   PainSc: 0-No pain                 Galit Urich L Monterius Rolf

## 2019-12-08 DIAGNOSIS — S46011D Strain of muscle(s) and tendon(s) of the rotator cuff of right shoulder, subsequent encounter: Secondary | ICD-10-CM | POA: Diagnosis not present

## 2019-12-08 DIAGNOSIS — Z9889 Other specified postprocedural states: Secondary | ICD-10-CM | POA: Diagnosis not present

## 2019-12-11 DIAGNOSIS — R69 Illness, unspecified: Secondary | ICD-10-CM | POA: Diagnosis not present

## 2019-12-16 DIAGNOSIS — M75121 Complete rotator cuff tear or rupture of right shoulder, not specified as traumatic: Secondary | ICD-10-CM | POA: Diagnosis not present

## 2019-12-16 DIAGNOSIS — M25611 Stiffness of right shoulder, not elsewhere classified: Secondary | ICD-10-CM | POA: Diagnosis not present

## 2019-12-19 DIAGNOSIS — M25611 Stiffness of right shoulder, not elsewhere classified: Secondary | ICD-10-CM | POA: Diagnosis not present

## 2019-12-19 DIAGNOSIS — M75121 Complete rotator cuff tear or rupture of right shoulder, not specified as traumatic: Secondary | ICD-10-CM | POA: Diagnosis not present

## 2019-12-22 DIAGNOSIS — M75121 Complete rotator cuff tear or rupture of right shoulder, not specified as traumatic: Secondary | ICD-10-CM | POA: Diagnosis not present

## 2019-12-22 DIAGNOSIS — M25611 Stiffness of right shoulder, not elsewhere classified: Secondary | ICD-10-CM | POA: Diagnosis not present

## 2019-12-24 DIAGNOSIS — M25611 Stiffness of right shoulder, not elsewhere classified: Secondary | ICD-10-CM | POA: Diagnosis not present

## 2019-12-24 DIAGNOSIS — M75121 Complete rotator cuff tear or rupture of right shoulder, not specified as traumatic: Secondary | ICD-10-CM | POA: Diagnosis not present

## 2019-12-30 DIAGNOSIS — M25611 Stiffness of right shoulder, not elsewhere classified: Secondary | ICD-10-CM | POA: Diagnosis not present

## 2019-12-30 DIAGNOSIS — M75121 Complete rotator cuff tear or rupture of right shoulder, not specified as traumatic: Secondary | ICD-10-CM | POA: Diagnosis not present

## 2020-01-02 DIAGNOSIS — M25611 Stiffness of right shoulder, not elsewhere classified: Secondary | ICD-10-CM | POA: Diagnosis not present

## 2020-01-02 DIAGNOSIS — M75121 Complete rotator cuff tear or rupture of right shoulder, not specified as traumatic: Secondary | ICD-10-CM | POA: Diagnosis not present

## 2020-01-05 DIAGNOSIS — M75121 Complete rotator cuff tear or rupture of right shoulder, not specified as traumatic: Secondary | ICD-10-CM | POA: Diagnosis not present

## 2020-01-05 DIAGNOSIS — M25611 Stiffness of right shoulder, not elsewhere classified: Secondary | ICD-10-CM | POA: Diagnosis not present

## 2020-01-06 DIAGNOSIS — R69 Illness, unspecified: Secondary | ICD-10-CM | POA: Diagnosis not present

## 2020-01-07 DIAGNOSIS — M75121 Complete rotator cuff tear or rupture of right shoulder, not specified as traumatic: Secondary | ICD-10-CM | POA: Diagnosis not present

## 2020-01-07 DIAGNOSIS — M25611 Stiffness of right shoulder, not elsewhere classified: Secondary | ICD-10-CM | POA: Diagnosis not present

## 2020-01-15 DIAGNOSIS — M75121 Complete rotator cuff tear or rupture of right shoulder, not specified as traumatic: Secondary | ICD-10-CM | POA: Diagnosis not present

## 2020-01-15 DIAGNOSIS — M25611 Stiffness of right shoulder, not elsewhere classified: Secondary | ICD-10-CM | POA: Diagnosis not present

## 2020-01-19 DIAGNOSIS — M25611 Stiffness of right shoulder, not elsewhere classified: Secondary | ICD-10-CM | POA: Diagnosis not present

## 2020-01-19 DIAGNOSIS — M75121 Complete rotator cuff tear or rupture of right shoulder, not specified as traumatic: Secondary | ICD-10-CM | POA: Diagnosis not present

## 2020-01-22 DIAGNOSIS — M75121 Complete rotator cuff tear or rupture of right shoulder, not specified as traumatic: Secondary | ICD-10-CM | POA: Diagnosis not present

## 2020-01-22 DIAGNOSIS — M25611 Stiffness of right shoulder, not elsewhere classified: Secondary | ICD-10-CM | POA: Diagnosis not present

## 2020-01-26 DIAGNOSIS — M75121 Complete rotator cuff tear or rupture of right shoulder, not specified as traumatic: Secondary | ICD-10-CM | POA: Diagnosis not present

## 2020-01-26 DIAGNOSIS — M25611 Stiffness of right shoulder, not elsewhere classified: Secondary | ICD-10-CM | POA: Diagnosis not present

## 2020-01-28 DIAGNOSIS — M75121 Complete rotator cuff tear or rupture of right shoulder, not specified as traumatic: Secondary | ICD-10-CM | POA: Diagnosis not present

## 2020-01-28 DIAGNOSIS — M25611 Stiffness of right shoulder, not elsewhere classified: Secondary | ICD-10-CM | POA: Diagnosis not present

## 2020-01-30 DIAGNOSIS — R69 Illness, unspecified: Secondary | ICD-10-CM | POA: Diagnosis not present

## 2020-02-02 DIAGNOSIS — M75121 Complete rotator cuff tear or rupture of right shoulder, not specified as traumatic: Secondary | ICD-10-CM | POA: Diagnosis not present

## 2020-02-02 DIAGNOSIS — M25611 Stiffness of right shoulder, not elsewhere classified: Secondary | ICD-10-CM | POA: Diagnosis not present

## 2020-02-03 DIAGNOSIS — T63441A Toxic effect of venom of bees, accidental (unintentional), initial encounter: Secondary | ICD-10-CM | POA: Diagnosis not present

## 2020-02-03 DIAGNOSIS — S90561A Insect bite (nonvenomous), right ankle, initial encounter: Secondary | ICD-10-CM | POA: Diagnosis not present

## 2020-02-04 DIAGNOSIS — M25611 Stiffness of right shoulder, not elsewhere classified: Secondary | ICD-10-CM | POA: Diagnosis not present

## 2020-02-04 DIAGNOSIS — M75121 Complete rotator cuff tear or rupture of right shoulder, not specified as traumatic: Secondary | ICD-10-CM | POA: Diagnosis not present

## 2020-02-09 DIAGNOSIS — M75121 Complete rotator cuff tear or rupture of right shoulder, not specified as traumatic: Secondary | ICD-10-CM | POA: Diagnosis not present

## 2020-02-09 DIAGNOSIS — M25611 Stiffness of right shoulder, not elsewhere classified: Secondary | ICD-10-CM | POA: Diagnosis not present

## 2020-02-11 DIAGNOSIS — M25611 Stiffness of right shoulder, not elsewhere classified: Secondary | ICD-10-CM | POA: Diagnosis not present

## 2020-02-11 DIAGNOSIS — M75121 Complete rotator cuff tear or rupture of right shoulder, not specified as traumatic: Secondary | ICD-10-CM | POA: Diagnosis not present

## 2020-02-17 DIAGNOSIS — M25611 Stiffness of right shoulder, not elsewhere classified: Secondary | ICD-10-CM | POA: Diagnosis not present

## 2020-02-17 DIAGNOSIS — M75121 Complete rotator cuff tear or rupture of right shoulder, not specified as traumatic: Secondary | ICD-10-CM | POA: Diagnosis not present

## 2020-02-19 DIAGNOSIS — R69 Illness, unspecified: Secondary | ICD-10-CM | POA: Diagnosis not present

## 2020-02-23 DIAGNOSIS — M25611 Stiffness of right shoulder, not elsewhere classified: Secondary | ICD-10-CM | POA: Diagnosis not present

## 2020-02-23 DIAGNOSIS — M75121 Complete rotator cuff tear or rupture of right shoulder, not specified as traumatic: Secondary | ICD-10-CM | POA: Diagnosis not present

## 2020-03-01 DIAGNOSIS — M25611 Stiffness of right shoulder, not elsewhere classified: Secondary | ICD-10-CM | POA: Diagnosis not present

## 2020-03-01 DIAGNOSIS — M75121 Complete rotator cuff tear or rupture of right shoulder, not specified as traumatic: Secondary | ICD-10-CM | POA: Diagnosis not present

## 2020-03-08 DIAGNOSIS — M25611 Stiffness of right shoulder, not elsewhere classified: Secondary | ICD-10-CM | POA: Diagnosis not present

## 2020-03-08 DIAGNOSIS — M75121 Complete rotator cuff tear or rupture of right shoulder, not specified as traumatic: Secondary | ICD-10-CM | POA: Diagnosis not present

## 2020-03-15 DIAGNOSIS — M25611 Stiffness of right shoulder, not elsewhere classified: Secondary | ICD-10-CM | POA: Diagnosis not present

## 2020-03-15 DIAGNOSIS — M75121 Complete rotator cuff tear or rupture of right shoulder, not specified as traumatic: Secondary | ICD-10-CM | POA: Diagnosis not present

## 2020-03-23 DIAGNOSIS — R05 Cough: Secondary | ICD-10-CM | POA: Diagnosis not present

## 2020-03-23 DIAGNOSIS — R509 Fever, unspecified: Secondary | ICD-10-CM | POA: Diagnosis not present

## 2020-03-23 DIAGNOSIS — R1030 Lower abdominal pain, unspecified: Secondary | ICD-10-CM | POA: Diagnosis not present

## 2020-03-25 DIAGNOSIS — R69 Illness, unspecified: Secondary | ICD-10-CM | POA: Diagnosis not present

## 2020-03-29 DIAGNOSIS — M25611 Stiffness of right shoulder, not elsewhere classified: Secondary | ICD-10-CM | POA: Diagnosis not present

## 2020-03-29 DIAGNOSIS — M75121 Complete rotator cuff tear or rupture of right shoulder, not specified as traumatic: Secondary | ICD-10-CM | POA: Diagnosis not present

## 2020-04-05 DIAGNOSIS — M75121 Complete rotator cuff tear or rupture of right shoulder, not specified as traumatic: Secondary | ICD-10-CM | POA: Diagnosis not present

## 2020-04-05 DIAGNOSIS — M25611 Stiffness of right shoulder, not elsewhere classified: Secondary | ICD-10-CM | POA: Diagnosis not present

## 2020-04-09 DIAGNOSIS — M25611 Stiffness of right shoulder, not elsewhere classified: Secondary | ICD-10-CM | POA: Diagnosis not present

## 2020-04-13 DIAGNOSIS — M25611 Stiffness of right shoulder, not elsewhere classified: Secondary | ICD-10-CM | POA: Diagnosis not present

## 2020-04-13 DIAGNOSIS — M75121 Complete rotator cuff tear or rupture of right shoulder, not specified as traumatic: Secondary | ICD-10-CM | POA: Diagnosis not present

## 2020-04-15 DIAGNOSIS — R69 Illness, unspecified: Secondary | ICD-10-CM | POA: Diagnosis not present

## 2020-04-26 DIAGNOSIS — M75121 Complete rotator cuff tear or rupture of right shoulder, not specified as traumatic: Secondary | ICD-10-CM | POA: Diagnosis not present

## 2020-04-26 DIAGNOSIS — M25611 Stiffness of right shoulder, not elsewhere classified: Secondary | ICD-10-CM | POA: Diagnosis not present

## 2020-05-03 DIAGNOSIS — M25611 Stiffness of right shoulder, not elsewhere classified: Secondary | ICD-10-CM | POA: Diagnosis not present

## 2020-05-03 DIAGNOSIS — M75121 Complete rotator cuff tear or rupture of right shoulder, not specified as traumatic: Secondary | ICD-10-CM | POA: Diagnosis not present

## 2020-05-04 DIAGNOSIS — R69 Illness, unspecified: Secondary | ICD-10-CM | POA: Diagnosis not present

## 2020-05-10 DIAGNOSIS — M75121 Complete rotator cuff tear or rupture of right shoulder, not specified as traumatic: Secondary | ICD-10-CM | POA: Diagnosis not present

## 2020-05-10 DIAGNOSIS — M25611 Stiffness of right shoulder, not elsewhere classified: Secondary | ICD-10-CM | POA: Diagnosis not present

## 2020-05-17 DIAGNOSIS — M75121 Complete rotator cuff tear or rupture of right shoulder, not specified as traumatic: Secondary | ICD-10-CM | POA: Diagnosis not present

## 2020-05-17 DIAGNOSIS — M25611 Stiffness of right shoulder, not elsewhere classified: Secondary | ICD-10-CM | POA: Diagnosis not present

## 2020-05-21 DIAGNOSIS — Z09 Encounter for follow-up examination after completed treatment for conditions other than malignant neoplasm: Secondary | ICD-10-CM | POA: Diagnosis not present

## 2020-05-21 DIAGNOSIS — M25611 Stiffness of right shoulder, not elsewhere classified: Secondary | ICD-10-CM | POA: Diagnosis not present

## 2020-05-24 DIAGNOSIS — M25611 Stiffness of right shoulder, not elsewhere classified: Secondary | ICD-10-CM | POA: Diagnosis not present

## 2020-05-24 DIAGNOSIS — M75121 Complete rotator cuff tear or rupture of right shoulder, not specified as traumatic: Secondary | ICD-10-CM | POA: Diagnosis not present

## 2020-05-27 DIAGNOSIS — R69 Illness, unspecified: Secondary | ICD-10-CM | POA: Diagnosis not present

## 2020-05-31 DIAGNOSIS — M25611 Stiffness of right shoulder, not elsewhere classified: Secondary | ICD-10-CM | POA: Diagnosis not present

## 2020-05-31 DIAGNOSIS — M75121 Complete rotator cuff tear or rupture of right shoulder, not specified as traumatic: Secondary | ICD-10-CM | POA: Diagnosis not present

## 2020-06-09 DIAGNOSIS — L814 Other melanin hyperpigmentation: Secondary | ICD-10-CM | POA: Diagnosis not present

## 2020-06-09 DIAGNOSIS — D485 Neoplasm of uncertain behavior of skin: Secondary | ICD-10-CM | POA: Diagnosis not present

## 2020-06-09 DIAGNOSIS — L609 Nail disorder, unspecified: Secondary | ICD-10-CM | POA: Diagnosis not present

## 2020-06-09 DIAGNOSIS — L905 Scar conditions and fibrosis of skin: Secondary | ICD-10-CM | POA: Diagnosis not present

## 2020-06-09 DIAGNOSIS — L821 Other seborrheic keratosis: Secondary | ICD-10-CM | POA: Diagnosis not present

## 2020-06-09 DIAGNOSIS — D1801 Hemangioma of skin and subcutaneous tissue: Secondary | ICD-10-CM | POA: Diagnosis not present

## 2020-06-10 DIAGNOSIS — R69 Illness, unspecified: Secondary | ICD-10-CM | POA: Diagnosis not present

## 2020-06-11 DIAGNOSIS — M75121 Complete rotator cuff tear or rupture of right shoulder, not specified as traumatic: Secondary | ICD-10-CM | POA: Diagnosis not present

## 2020-06-11 DIAGNOSIS — M25611 Stiffness of right shoulder, not elsewhere classified: Secondary | ICD-10-CM | POA: Diagnosis not present

## 2020-06-16 ENCOUNTER — Other Ambulatory Visit: Payer: Self-pay | Admitting: Family Medicine

## 2020-06-21 ENCOUNTER — Other Ambulatory Visit: Payer: Self-pay | Admitting: Family Medicine

## 2020-06-21 DIAGNOSIS — Z1231 Encounter for screening mammogram for malignant neoplasm of breast: Secondary | ICD-10-CM

## 2020-07-30 ENCOUNTER — Ambulatory Visit: Payer: 59

## 2020-09-27 ENCOUNTER — Ambulatory Visit
Admission: RE | Admit: 2020-09-27 | Discharge: 2020-09-27 | Disposition: A | Discharge status: Home / Self Care | Payer: 59 | Source: Ambulatory Visit | Attending: Family Medicine | Admitting: Family Medicine

## 2020-09-27 ENCOUNTER — Other Ambulatory Visit: Payer: Self-pay

## 2020-09-27 DIAGNOSIS — Z1231 Encounter for screening mammogram for malignant neoplasm of breast: Secondary | ICD-10-CM

## 2020-12-06 NOTE — Progress Notes (Signed)
[facility] [address] [place] [address] Phone: [phone] Subjective:   [name], am serving as a scribe for Dr. Antoine Primas. This visit occurred during the SARS-CoV-2 public health emergency.  Safety protocols were in place, including screening questions prior to the visit, additional usage of staff PPE, and extensive cleaning of exam room while observing appropriate contact time as indicated for disinfecting solutions.   I'm seeing this patient by the request  of:  Laurann Montana, MD  CC: Bilateral knee pain  [id number]  Kaitlyn Dominguez is a 65 y.o. female coming in with complaint of B knee pain, R>L. Last seen in April 2021 for R RTC tear. Underwent shoulder arthroscopy April 2021. Patient states that she hikes, does yoga. Has had pain for years. Using poles now to hike. Achy pain in entire joint after working out. Feels tightness on sides of R knee with flexion. Patient stretches, uses Celebrex prn, heat, and Tylenol and/or Advil. History of lateral release on L knee 2001, second surgery in 2004 had knee arthroscopy to clean up scar tissue. Also has had multiple surgeries on L foot from when she was 65 years old. Trip planned over Labor Day to [facility] to go hiking.       Past Medical History:  Diagnosis Date  . Anxiety   . Osteoarthritis   . PONV (postoperative nausea and vomiting)    had emesis after surgery when went home  . Serum calcium elevated   . Vaginal atrophy    Past Surgical History:  Procedure Laterality Date  . COLONOSCOPY    . FOOT SURGERY     left; on multiple occasions  . KNEE ARTHROSCOPY  2001, 2005   left x 2  . SHOULDER ARTHROSCOPY WITH ROTATOR CUFF REPAIR AND SUBACROMIAL DECOMPRESSION Right 12/01/2019   Procedure: SHOULDER ARTHROSCOPY WITH ROTATOR CUFF REPAIR, DISTAL CLAVICLE EXCISION;  Surgeon: Jones Broom, MD;  Location: [facility];  Service: Orthopedics;  Laterality: Right;  .  SUBACROMIAL DECOMPRESSION Right 12/01/2019   Procedure: SUBACROMIAL DECOMPRESSION;  Surgeon: Jones Broom, MD;  Location: [facility];  Service: Orthopedics;  Laterality: Right;   Social History   Socioeconomic History  . Marital status: Legally Separated    Spouse name: Not on file  . Number of children: 3  . Years of education: Not on file  . Highest education level: Not on file  Occupational History    Employer: [employer]  Tobacco Use  . Smoking status: Never Smoker  . Smokeless tobacco: Never Used  Vaping Use  . Vaping Use: Never used  Substance and Sexual Activity  . Alcohol use: Yes    Comment: 1 drink daily  . Drug use: No  . Sexual activity: Not on file  Other Topics Concern  . Not on file  Social History Narrative  . Not on file   Social Determinants of Health   Financial Resource Strain: Not on file  Food Insecurity: Not on file  Transportation Needs: Not on file  Physical Activity: Not on file  Stress: Not on file  Social Connections: Not on file   Allergies  Allergen Reactions  . Penicillins     REACTION: hives   Family History  Problem Relation Age of Onset  . Heart attack Mother   . Heart disease Maternal Grandmother   . Alzheimer's disease Father   . Colon cancer Neg Hx   . Esophageal cancer Neg  Hx   . Rectal cancer Neg Hx   . Stomach cancer Neg Hx        Current Outpatient Medications (Analgesics):  .  celecoxib (CELEBREX) 200 MG capsule, One to 2 tablets by mouth daily as needed for pain. Marland Kitchen  ibuprofen (ADVIL,MOTRIN) 200 MG tablet, Take 200 mg by mouth every 6 (six) hours as needed.   Current Outpatient Medications (Other):  Marland Kitchen  Diclofenac Sodium (PENNSAID) 2 % SOLN, Place 2 g onto the skin 2 (two) times daily. Marland Kitchen  LORazepam (ATIVAN) 0.5 MG tablet, Take 0.5 mg 2 (two) times daily as needed by mouth. .  Omega-3 Fatty Acids (FISH OIL PO), Take by mouth. .  polyethylene glycol powder  (GLYCOLAX/MIRALAX) powder, Take 17 g by mouth daily. Marland Kitchen  venlafaxine (EFFEXOR) 37.5 MG tablet, Take 37.5 mg by mouth daily. .  Vitamin D, Ergocalciferol, (DRISDOL) 1.25 MG (50000 UNIT) CAPS capsule, Take 1 capsule (50,000 Units total) by mouth every 7 (seven) days.   Reviewed prior external information including notes and imaging from  primary care provider As well as notes that were available from care everywhere and other healthcare systems.  Past medical history, social, surgical and family history all reviewed in electronic medical record.  No pertanent information unless stated regarding to the chief complaint.   Review of Systems:  No headache, visual changes, nausea, vomiting, diarrhea, constipation, dizziness, abdominal pain, skin rash, fevers, chills, night sweats, weight loss, swollen lymph nodes, body aches, joint swelling, chest pain, shortness of breath, mood changes. POSITIVE muscle aches  Objective  Blood pressure 118/84, pulse 79, height 5\' 7"  (1.702 m), weight 141 lb (64 kg), SpO2 98 %.   General: No apparent distress alert and oriented x3 mood and affect normal, dressed appropriately.  HEENT: Pupils equal, extraocular movements intact  Respiratory: Patient's speak in full sentences and does not appear short of breath  Cardiovascular: No lower extremity edema, non tender, no erythema  Gait normal with good balance and coordination.  MSK: Bilateral knees show the patient does have some arthritic changes.  He seems to be left greater than right.  Positive patellar grind test bilaterally left greater than right.  Tender to palpation over the medial joint line left greater than right.  Mild instability noted with valgus force bilaterally.  Limited musculoskeletal ultrasound was performed and interpreted by  Limited ultrasound of patient's knees bilaterally show that patient does have moderate narrowing of the patellofemoral joint as well as the medial joint space.   Patient does have what appears to be CPPD of the meniscus bilaterally but no acute tear appreciated. Impression: Moderate arthritis with CPPD  After informed written and verbal consent, patient was seated on exam table. Right knee was prepped with alcohol swab and utilizing anterolateral approach, patient's right knee space was injected with 4:1  marcaine 0.5%: Kenalog 40mg /dL. Patient tolerated the procedure well without immediate complications.  After informed written and verbal consent, patient was seated on exam table. Left knee was prepped with alcohol swab and utilizing anterolateral approach, patient's left knee space was injected with 4:1  marcaine 0.5%: Kenalog 40mg /dL. Patient tolerated the procedure well without immediate complications.    Impression and Recommendations:     The above documentation has been reviewed and is accurate and complete Judi Saa, DO

## 2020-12-07 ENCOUNTER — Ambulatory Visit (INDEPENDENT_AMBULATORY_CARE_PROVIDER_SITE_OTHER): Payer: Managed Care, Other (non HMO)

## 2020-12-07 ENCOUNTER — Encounter: Payer: Self-pay | Admitting: Family Medicine

## 2020-12-07 ENCOUNTER — Ambulatory Visit (INDEPENDENT_AMBULATORY_CARE_PROVIDER_SITE_OTHER): Payer: Managed Care, Other (non HMO) | Admitting: Family Medicine

## 2020-12-07 ENCOUNTER — Ambulatory Visit: Payer: Self-pay

## 2020-12-07 ENCOUNTER — Other Ambulatory Visit: Payer: Self-pay

## 2020-12-07 VITALS — BP 118/84 | HR 79 | Ht 67.0 in | Wt 141.0 lb

## 2020-12-07 DIAGNOSIS — G8929 Other chronic pain: Secondary | ICD-10-CM | POA: Diagnosis not present

## 2020-12-07 DIAGNOSIS — M1120 Other chondrocalcinosis, unspecified site: Secondary | ICD-10-CM

## 2020-12-07 DIAGNOSIS — M2550 Pain in unspecified joint: Secondary | ICD-10-CM

## 2020-12-07 DIAGNOSIS — M25561 Pain in right knee: Secondary | ICD-10-CM | POA: Diagnosis not present

## 2020-12-07 DIAGNOSIS — M1710 Unilateral primary osteoarthritis, unspecified knee: Secondary | ICD-10-CM | POA: Insufficient documentation

## 2020-12-07 DIAGNOSIS — M25562 Pain in left knee: Secondary | ICD-10-CM

## 2020-12-07 DIAGNOSIS — M170 Bilateral primary osteoarthritis of knee: Secondary | ICD-10-CM

## 2020-12-07 DIAGNOSIS — M179 Osteoarthritis of knee, unspecified: Secondary | ICD-10-CM | POA: Insufficient documentation

## 2020-12-07 LAB — VITAMIN D 25 HYDROXY (VIT D DEFICIENCY, FRACTURES): VITD: 22.64 ng/mL — ABNORMAL LOW (ref 30.00–100.00)

## 2020-12-07 LAB — SEDIMENTATION RATE: Sed Rate: 3 mm/hr (ref 0–30)

## 2020-12-07 LAB — URIC ACID: Uric Acid, Serum: 4 mg/dL (ref 2.4–7.0)

## 2020-12-07 NOTE — Assessment & Plan Note (Signed)
We will get laboratory work-up to make sure there is no hypercalcemia that could be contributing as well.

## 2020-12-07 NOTE — Patient Instructions (Addendum)
Xray today Exercises 3x a week Tru pull lite L Injections today Labs today See you in July Message [place] when you have insurance and we will run you for the gel

## 2020-12-07 NOTE — Assessment & Plan Note (Signed)
Bilateral injections given today, tolerated the procedure well.  Discussed icing regimen and home exercises.  Discussed topical anti-inflammatories.  Patient will increase activity slowly over the course the next several weeks.  Follow-up with me again in 2 months.  At that time if worsening pain we will see if patient is approved for viscosupplementation.  Patient could be changing insurance in the near future and if so she will call us and then get the prior approval for the viscosupplementation at that time.  Patient knows if any worsening pain to seek medical attention sooner.

## 2020-12-09 ENCOUNTER — Ambulatory Visit: Payer: 59 | Admitting: Family Medicine

## 2020-12-09 LAB — PTH, INTACT AND CALCIUM
Calcium: 11.2 mg/dL — ABNORMAL HIGH (ref 8.6–10.4)
PTH: 65 pg/mL (ref 16–77)

## 2020-12-09 LAB — CALCIUM, IONIZED: Calcium, Ion: 6.01 mg/dL — ABNORMAL HIGH (ref 4.8–5.6)

## 2020-12-10 ENCOUNTER — Encounter: Payer: Self-pay | Admitting: Family Medicine

## 2021-02-22 DIAGNOSIS — F419 Anxiety disorder, unspecified: Secondary | ICD-10-CM | POA: Diagnosis not present

## 2021-02-28 NOTE — Progress Notes (Signed)
[name] [address] [place] [address] Phone: [phone] Subjective:   Kaitlyn Dominguez, am serving as a scribe for Dr. Antoine Primas. This visit occurred during the SARS-CoV-2 public health emergency.  Safety protocols were in place, including screening questions prior to the visit, additional usage of staff PPE, and extensive cleaning of exam room while observing appropriate contact time as indicated for disinfecting solutions.   I'm seeing this patient by the request  of:  Laurann Montana, MD  CC: Bilateral knee pain  [id number]  12/07/2020 Bilateral injections given today, tolerated the procedure well.  Discussed icing regimen and home exercises.  Discussed topical anti-inflammatories.  Patient will increase activity slowly over the course the next several weeks.  Follow-up with me again in 2 months.  At that time if worsening pain we will see if patient is approved for viscosupplementation.  Patient could be changing insurance in the near future and if so she will call us and then get the prior approval for the viscosupplementation at that time.  Patient knows if any worsening pain to seek medical attention sooner.  We will get laboratory work-up to make sure there is no hypercalcemia that could be contributing as well.  Update 03/01/2021 Kaitlyn Dominguez is a 65 y.o. female coming in with complaint of R knee pain. Patient states that her pain is when she is going down hill. Did have relief from last injections. Would like to get calcium check today.  Patient has had hypercalcemia on last exam.  Also low vitamin D.  Patient has been doing the vitamin D supplementation.  We will continue to monitor.  Continuing on the Effexor.     Past Medical History:  Diagnosis Date   Anxiety    Osteoarthritis    PONV (postoperative nausea and vomiting)    had emesis after surgery when went home   Serum calcium elevated    Vaginal atrophy    Past  Surgical History:  Procedure Laterality Date   COLONOSCOPY     FOOT SURGERY     left; on multiple occasions   KNEE ARTHROSCOPY  2001, 2005   left x 2   SHOULDER ARTHROSCOPY WITH ROTATOR CUFF REPAIR AND SUBACROMIAL DECOMPRESSION Right 12/01/2019   Procedure: SHOULDER ARTHROSCOPY WITH ROTATOR CUFF REPAIR, DISTAL CLAVICLE EXCISION;  Surgeon: Jones Broom, MD;  Location: [facility];  Service: Orthopedics;  Laterality: Right;   SUBACROMIAL DECOMPRESSION Right 12/01/2019   Procedure: SUBACROMIAL DECOMPRESSION;  Surgeon: Jones Broom, MD;  [facility];  Service: Orthopedics;  Laterality: Right;   Social History   Socioeconomic History   Marital status: Legally Separated    Spouse name: Not on file   Number of children: 3   Years of education: Not on file   Highest education level: Not on file  Occupational History    Employer: [employer]  Tobacco Use   Smoking status: Never   Smokeless tobacco: Never  Vaping Use   Vaping Use: Never used  Substance and Sexual Activity   Alcohol use: Yes    Comment: 1 drink daily   Drug use: No   Sexual activity: Not on file  Other Topics Concern   Not on file  Social History Narrative   Not on file   Social Determinants of Health   Financial Resource Strain: Not on file  Food Insecurity: Not on file  Transportation Needs: Not on file  Physical Activity:  Not on file  Stress: Not on file  Social Connections: Not on file   Allergies  Allergen Reactions   Penicillins     REACTION: hives   Family History  Problem Relation Age of Onset   Heart attack Mother    Heart disease Maternal Grandmother    Alzheimer's disease Father    Colon cancer Neg Hx    Esophageal cancer Neg Hx    Rectal cancer Neg Hx    Stomach cancer Neg Hx        Current Outpatient Medications (Analgesics):    celecoxib (CELEBREX) 200 MG capsule, One to 2 tablets by mouth daily as needed for  pain.   ibuprofen (ADVIL,MOTRIN) 200 MG tablet, Take 200 mg by mouth every 6 (six) hours as needed.   Current Outpatient Medications (Other):    Diclofenac Sodium (PENNSAID) 2 % SOLN, Place 2 g onto the skin 2 (two) times daily.   LORazepam (ATIVAN) 0.5 MG tablet, Take 0.5 mg 2 (two) times daily as needed by mouth.   Omega-3 Fatty Acids (FISH OIL PO), Take by mouth.   polyethylene glycol powder (GLYCOLAX/MIRALAX) powder, Take 17 g by mouth daily.   venlafaxine (EFFEXOR) 37.5 MG tablet, Take 37.5 mg by mouth daily.   Vitamin D, Ergocalciferol, (DRISDOL) 1.25 MG (50000 UNIT) CAPS capsule, Take 1 capsule (50,000 Units total) by mouth every 7 (seven) days.   Reviewed prior external information including notes and imaging from  primary care provider As well as notes that were available from care everywhere and other healthcare systems.  Past medical history, social, surgical and family history all reviewed in electronic medical record.  No pertanent information unless stated regarding to the chief complaint.   Review of Systems:  No headache, visual changes, nausea, vomiting, diarrhea, constipation, dizziness, abdominal pain, skin rash, fevers, chills, night sweats, weight loss, swollen lymph nodes, body aches, joint swelling, chest pain, shortness of breath, mood changes. POSITIVE muscle aches  Objective  Blood pressure 118/80, pulse 74, height 5\' 7"  (1.702 m), weight 138 lb (62.6 kg), SpO2 99 %.   General: No apparent distress alert and oriented x3 mood and affect normal, dressed appropriately.  HEENT: Pupils equal, extraocular movements intact  Respiratory: Patient's speak in full sentences and does not appear short of breath  Cardiovascular: No lower extremity edema, non tender, no erythema  Gait normal with good balance and coordination.  MSK: Knee exams bilaterally do has diffuse tenderness noted.  Patient is tender to palpation over the medial and patellofemoral joint right greater  than left.  Lateral tracking of the patella noted on the right side.  After informed written and verbal consent, patient was seated on exam table. Right knee was prepped with alcohol swab and utilizing anterolateral approach, patient's right knee space was injected with 4:1  marcaine 0.5%: Kenalog 40mg /dL. Patient tolerated the procedure well without immediate complications.  After informed written and verbal consent, patient was seated on exam table. Left knee was prepped with alcohol swab and utilizing anterolateral approach, patient's left knee space was injected with 4:1  marcaine 0.5%: Kenalog 40mg /dL. Patient tolerated the procedure well without immediate complications.    Impression and Recommendations:     The above documentation has been reviewed and is accurate and complete , DO

## 2021-03-02 ENCOUNTER — Ambulatory Visit (INDEPENDENT_AMBULATORY_CARE_PROVIDER_SITE_OTHER): Payer: BC Managed Care – PPO | Admitting: Family Medicine

## 2021-03-02 ENCOUNTER — Other Ambulatory Visit: Payer: Self-pay

## 2021-03-02 ENCOUNTER — Encounter: Payer: Self-pay | Admitting: Family Medicine

## 2021-03-02 VITALS — BP 118/80 | HR 74 | Ht 67.0 in | Wt 138.0 lb

## 2021-03-02 DIAGNOSIS — M170 Bilateral primary osteoarthritis of knee: Secondary | ICD-10-CM | POA: Diagnosis not present

## 2021-03-02 DIAGNOSIS — R7989 Other specified abnormal findings of blood chemistry: Secondary | ICD-10-CM

## 2021-03-02 DIAGNOSIS — M2550 Pain in unspecified joint: Secondary | ICD-10-CM

## 2021-03-02 LAB — VITAMIN D 25 HYDROXY (VIT D DEFICIENCY, FRACTURES): VITD: 32.8 ng/mL (ref 30.00–100.00)

## 2021-03-02 NOTE — Assessment & Plan Note (Signed)
Bilateral injections given today.  Tolerated the procedure well.  Discussed with topical anti-inflammatories and icing regimen.  Patient will continue with this at the moment.  Encouraged her to continue to work on the home exercises.  Could be a candidate for viscosupplementation but feels that the steroids have improved.  We will be going on a long hike and we will see.  Given name of proper bracing over-the-counter.  Follow-up again 3 months

## 2021-03-02 NOTE — Patient Instructions (Addendum)
Labs today Kaitlyn Dominguez pull lite See me in 2-3 months

## 2021-03-02 NOTE — Assessment & Plan Note (Signed)
With CPPD and low vitamin D we will recheck labs to see if this makes any improvement.  Otherwise further work-up with endocrinology may be necessary.

## 2021-03-03 LAB — PTH, INTACT AND CALCIUM
Calcium: 11.4 mg/dL — ABNORMAL HIGH (ref 8.6–10.4)
PTH: 50 pg/mL (ref 16–77)

## 2021-03-03 LAB — CALCIUM, IONIZED: Calcium, Ion: 5.98 mg/dL — ABNORMAL HIGH (ref 4.8–5.6)

## 2021-03-22 ENCOUNTER — Encounter: Payer: Self-pay | Admitting: Family Medicine

## 2021-04-28 ENCOUNTER — Ambulatory Visit (INDEPENDENT_AMBULATORY_CARE_PROVIDER_SITE_OTHER): Payer: BC Managed Care – PPO

## 2021-04-28 ENCOUNTER — Ambulatory Visit (INDEPENDENT_AMBULATORY_CARE_PROVIDER_SITE_OTHER): Payer: BC Managed Care – PPO | Admitting: Sports Medicine

## 2021-04-28 ENCOUNTER — Other Ambulatory Visit: Payer: Self-pay

## 2021-04-28 ENCOUNTER — Ambulatory Visit: Payer: Self-pay

## 2021-04-28 VITALS — BP 110/80 | HR 66 | Ht 67.0 in | Wt 137.0 lb

## 2021-04-28 DIAGNOSIS — B070 Plantar wart: Secondary | ICD-10-CM | POA: Diagnosis not present

## 2021-04-28 DIAGNOSIS — M79671 Pain in right foot: Secondary | ICD-10-CM

## 2021-04-28 NOTE — Progress Notes (Signed)
Kaitlyn Dominguez [facility] [address] [place] [address] Phone: [phone]   Assessment and Plan:     1. Right foot pain 2. Plantar wart of right foot -Acute, initial sports medicine visit - Likely plantar wart becoming tender after patient's heavy week of activity - X-ray obtained in clinic as ultrasound could not rule out foreign body.  My interpretation of x-ray: No foreign body seen, no acute fracture - Start over-the-counter salicylic acid for treatment of plantar wart.  If this is ineffective then follow-up with PCP for discussion of alternative medication versus cryotherapy - Use padding and shoe for comfort - Continue NSAIDs for 1-2 weeks - [place] LIMITED JOINT SPACE STRUCTURES LOW RIGHT(NO LINKED CHARGES); Future - DG Foot Complete Right; Future    Pertinent previous records reviewed include none   Follow Up: as needed if no improvement or worsening of symptoms     Subjective:   I, [name], am serving as a scribe for Dr. Richardean Sale  Chief Complaint: right foot pain  HPI: 65 year old female presenting with R foot pain.  04/28/21 Patient states that she has just got back from a hiking trip in [place] and is not sure if that is where the issue lies, but patients R foot did not start hurting till she was back from the trip. Pain started Saturday night and the run/hike was Thursday. Patient had a wedding where she wore dress shoes and that was on Saturday. Patient locates pain to the Lateral ball of R foot. Pain does not radiate, has swelling, no numbness or tingling. Patient has tried tylenol, ibuprofen, Ice and rest. Pain feels worse with pressure and worse at night.  Patient feels that she is developing a plantar wart on plantar surface of right foot.  She says the pain is primarily localized around this area.  Says that she used common area showers while on her trip.   Relevant Historical Information: Bilateral  knee pain  Additional pertinent review of systems negative.   Current Outpatient Medications:    celecoxib (CELEBREX) 200 MG capsule, One to 2 tablets by mouth daily as needed for pain., Disp: 60 capsule, Rfl: 2   Diclofenac Sodium (PENNSAID) 2 % SOLN, Place 2 g onto the skin 2 (two) times daily., Disp: 112 g, Rfl: 3   ibuprofen (ADVIL,MOTRIN) 200 MG tablet, Take 200 mg by mouth every 6 (six) hours as needed., Disp: , Rfl:    LORazepam (ATIVAN) 0.5 MG tablet, Take 0.5 mg 2 (two) times daily as needed by mouth., Disp: , Rfl: 3   Omega-3 Fatty Acids (FISH OIL PO), Take by mouth., Disp: , Rfl:    polyethylene glycol powder (GLYCOLAX/MIRALAX) powder, Take 17 g by mouth daily., Disp: 255 g, Rfl: 3   venlafaxine (EFFEXOR) 37.5 MG tablet, Take 37.5 mg by mouth daily., Disp: , Rfl:    Vitamin D, Ergocalciferol, (DRISDOL) 1.25 MG (50000 UNIT) CAPS capsule, Take 1 capsule (50,000 Units total) by mouth every 7 (seven) days., Disp: 12 capsule, Rfl: 0   Objective:     Vitals:   04/28/21 1034  BP: 110/80  Pulse: 66  SpO2: 99%  Weight: 137 lb (62.1 kg)  Height: 5\' 7"  (1.702 m)      Body mass index is 21.46 kg/m.    Physical Exam:    Gen: Appears well, nad, nontoxic and pleasant Psych: Alert and oriented, appropriate mood and affect Neuro: sensation intact, strength is 5/5 with  df/pf/inv/ev, muscle tone wnl Skin: Lesion on plantar surface at head of second and third metatarsal with thickened skin, central darkening.  No active drainage, erythema, warmth.  Right foot and ankle: no deformity, no swelling or effusion TTP on plantar surface of second and third metatarsal.  NTTP on dorsal surface. NTTP over fibular head, lat mal, medi 2 al mal, achilles, navicular, base of 5th, ATFL, CFL, deltoid, calcaneous or midfoot, plantar fascia ROM DF 30, PF 45, inv/ev intact Negative ant drawer, talar tilt, rotation test, squeeze test. Neg thompson No pain with resisted inversion or eversion    Sports  Medicine: Musculoskeletal Ultrasound. Exam: Limited US of right foot Diagnosis: Right foot pain  US Findings: Hyperechoic nodule with the surrounding edema at superficial surface of plantar surface of right foot near second third metatarsals.  Consistent with gray skin changes and appearing plantar wart.  Metatarsal shafts appear intact..  No significant swelling between metatarsals.  US Impression:  Plantar wart    Electronically signed by:  Kaitlyn Dominguez [facility] 11:18 AM 04/28/21

## 2021-04-28 NOTE — Patient Instructions (Addendum)
Good to see you - X-ray obtained in clinic as ultrasound could not rule out foreign body.  My interpretation of x-ray: No foreign body seen, no acute fracture - Start over-the-counter salicylic acid for treatment of plantar wart.  If this is ineffective then follow-up with PCP for discussion of alternative medication versus cryotherapy - Use padding and shoe for comfort  _ Continue NSAIDS for 1-2 weeks Follow up as needed

## 2021-05-03 DIAGNOSIS — H40023 Open angle with borderline findings, high risk, bilateral: Secondary | ICD-10-CM | POA: Diagnosis not present

## 2021-05-05 DIAGNOSIS — F419 Anxiety disorder, unspecified: Secondary | ICD-10-CM | POA: Diagnosis not present

## 2021-05-17 NOTE — Progress Notes (Signed)
[name] [address] [place] [address] Phone: [phone] Subjective:   [name], am serving as a scribe for Dr. Antoine Primas.  This visit occurred during the SARS-CoV-2 public health emergency.  Safety protocols were in place, including screening questions prior to the visit, additional usage of staff PPE, and extensive cleaning of exam room while observing appropriate contact time as indicated for disinfecting solutions.   I'm seeing this patient by the request  of:  Laurann Montana, MD  CC: Bilateral knee pain  NIO:[id number]  03/02/2021 Bilateral injections given today.  Tolerated the procedure well.  Discussed with topical anti-inflammatories and icing regimen.  Patient will continue with this at the moment.  Encouraged her to continue to work on the home exercises.  Could be a candidate for viscosupplementation but feels that the steroids have improved.  We will be going on a long hike and we will see.  Given name of proper bracing over-the-counter.  Follow-up again 3 months  Update 05/18/2021 Kaitlyn Dominguez is a 65 y.o. female coming in with complaint of B knee and R foot pain. Patient saw Dr. Jean Rosenthal for possible plantars wart in September. Patient states that she hiked and biked with pain in R knee greater than L knee. Pain over lateral aspect.  Known patellofemoral arthritis  R foot pain has improved.        Past Medical History:  Diagnosis Date   Anxiety    Osteoarthritis    PONV (postoperative nausea and vomiting)    had emesis after surgery when went home   Serum calcium elevated    Vaginal atrophy    Past Surgical History:  Procedure Laterality Date   COLONOSCOPY     FOOT SURGERY     left; on multiple occasions   KNEE ARTHROSCOPY  2001, 2005   left x 2   SHOULDER ARTHROSCOPY WITH ROTATOR CUFF REPAIR AND SUBACROMIAL DECOMPRESSION Right 12/01/2019   Procedure: SHOULDER ARTHROSCOPY WITH ROTATOR CUFF REPAIR, DISTAL  CLAVICLE EXCISION;  Surgeon: Jones Broom, MD;  Location: [facility];  Service: Orthopedics;  Laterality: Right;   SUBACROMIAL DECOMPRESSION Right 12/01/2019   Procedure: SUBACROMIAL DECOMPRESSION;  Surgeon: Jones Broom, MD;  Location: [facility];  Service: Orthopedics;  Laterality: Right;   Social History   Socioeconomic History   Marital status: Legally Separated    Spouse name: Not on file   Number of children: 3   Years of education: Not on file   Highest education level: Not on file  Occupational History    Employer: [employer]  Tobacco Use   Smoking status: Never   Smokeless tobacco: Never  Vaping Use   Vaping Use: Never used  Substance and Sexual Activity   Alcohol use: Yes    Comment: 1 drink daily   Drug use: No   Sexual activity: Not on file  Other Topics Concern   Not on file  Social History Narrative   Not on file   Social Determinants of Health   Financial Resource Strain: Not on file  Food Insecurity: Not on file  Transportation Needs: Not on file  Physical Activity: Not on file  Stress: Not on file  Social Connections: Not on file   Allergies  Allergen Reactions   Penicillins     REACTION: hives   Family History  Problem Relation Age of Onset   Heart attack Mother    Heart disease Maternal Grandmother  Alzheimer's disease Father    Colon cancer Neg Hx    Esophageal cancer Neg Hx    Rectal cancer Neg Hx    Stomach cancer Neg Hx        Current Outpatient Medications (Analgesics):    celecoxib (CELEBREX) 200 MG capsule, One to 2 tablets by mouth daily as needed for pain.   ibuprofen (ADVIL,MOTRIN) 200 MG tablet, Take 200 mg by mouth every 6 (six) hours as needed.   Current Outpatient Medications (Other):    cholecalciferol (VITAMIN D3) 25 MCG (1000 UNIT) tablet, Take 1,000 Units by mouth daily.   Diclofenac Sodium (PENNSAID) 2 % SOLN, Place 2 g onto the skin 2 (two) times  daily.   LORazepam (ATIVAN) 0.5 MG tablet, Take 0.5 mg 2 (two) times daily as needed by mouth.   Omega-3 Fatty Acids (FISH OIL PO), Take by mouth.   polyethylene glycol powder (GLYCOLAX/MIRALAX) powder, Take 17 g by mouth daily.   venlafaxine (EFFEXOR) 37.5 MG tablet, Take 37.5 mg by mouth daily.   Reviewed prior external information including notes and imaging from  primary care provider As well as notes that were available from care everywhere and other healthcare systems.  Past medical history, social, surgical and family history all reviewed in electronic medical record.  No pertanent information unless stated regarding to the chief complaint.   Review of Systems:  No headache, visual changes, nausea, vomiting, diarrhea, constipation, dizziness, abdominal pain, skin rash, fevers, chills, night sweats, weight loss, swollen lymph nodes, body aches, joint swelling, chest pain, shortness of breath, mood changes. POSITIVE muscle aches  Objective  Blood pressure 110/82, pulse 67, height 5\' 7"  (1.702 m), weight 137 lb (62.1 kg), SpO2 99 %.   General: No apparent distress alert and oriented x3 mood and affect normal, dressed appropriately.  HEENT: Pupils equal, extraocular movements intact  Respiratory: Patient's speak in full sentences and does not appear short of breath  Cardiovascular: No lower extremity edema, non tender, no erythema  Gait normal with good balance and coordination.  MSK: Bilateral knee exam shows the patient does have small effusions bilaterally.  Seems to be worse right greater than left.  Crepitus noted.  Lateral tracking of the patella noted.  After informed written and verbal consent, patient was seated on exam table. Right knee was prepped with alcohol swab and utilizing anterolateral approach, patient's right knee space was injected with 4:1  marcaine 0.5%: Kenalog 40mg /dL. Patient tolerated the procedure well without immediate complications.  After informed written  and verbal consent, patient was seated on exam table. Left knee was prepped with alcohol swab and utilizing anterolateral approach, patient's left knee space was injected with 4:1  marcaine 0.5%: Kenalog 40mg /dL. Patient tolerated the procedure well without immediate complications.   Impression and Recommendations:    The above documentation has been reviewed and is accurate and complete , DO

## 2021-05-18 ENCOUNTER — Ambulatory Visit: Payer: Self-pay

## 2021-05-18 ENCOUNTER — Encounter: Payer: Self-pay | Admitting: Family Medicine

## 2021-05-18 ENCOUNTER — Other Ambulatory Visit: Payer: Self-pay

## 2021-05-18 ENCOUNTER — Ambulatory Visit (INDEPENDENT_AMBULATORY_CARE_PROVIDER_SITE_OTHER): Payer: BC Managed Care – PPO | Admitting: Family Medicine

## 2021-05-18 VITALS — BP 110/82 | HR 67 | Ht 67.0 in | Wt 137.0 lb

## 2021-05-18 DIAGNOSIS — M79671 Pain in right foot: Secondary | ICD-10-CM | POA: Diagnosis not present

## 2021-05-18 DIAGNOSIS — M170 Bilateral primary osteoarthritis of knee: Secondary | ICD-10-CM | POA: Diagnosis not present

## 2021-05-18 NOTE — Assessment & Plan Note (Signed)
Repeat injections given May 18, 2021.  Patient has responded well to the injections previously and will be hiking again.  Hopefully this will be helpful.  Discussed the possibility of viscosupplementation for this chronic problem with exacerbation.  We will see if we can get approval for it.  In addition to this we will talk about the possibility of PRP given today.  Follow-up with me again in 6 weeks

## 2021-05-18 NOTE — Patient Instructions (Signed)
Will get you approved for gel See me in 6 weeks

## 2021-05-24 DIAGNOSIS — Z111 Encounter for screening for respiratory tuberculosis: Secondary | ICD-10-CM | POA: Diagnosis not present

## 2021-05-24 DIAGNOSIS — Z1159 Encounter for screening for other viral diseases: Secondary | ICD-10-CM | POA: Diagnosis not present

## 2021-06-22 DIAGNOSIS — B079 Viral wart, unspecified: Secondary | ICD-10-CM | POA: Diagnosis not present

## 2021-06-28 NOTE — Progress Notes (Signed)
[name] [address] [place] [address] Phone: [phone] Subjective:   [name], am serving as a scribe for Dr. Antoine Primas. This visit occurred during the SARS-CoV-2 public health emergency.  Safety protocols were in place, including screening questions prior to the visit, additional usage of staff PPE, and extensive cleaning of exam room while observing appropriate contact time as indicated for disinfecting solutions.   I'm seeing this patient by the request  of:  Laurann Montana, MD  CC: Knee pain, arm pain  MBW:[id number]  05/18/2021 Repeat injections given May 18, 2021.  Patient has responded well to the injections previously and will be hiking again.  Hopefully this will be helpful.  Discussed the possibility of viscosupplementation for this chronic problem with exacerbation.  We will see if we can get approval for it.  In addition to this we will talk about the possibility of PRP given today.  Follow-up with me again in 6 weeks  Update 06/29/2021 Kaitlyn Dominguez is a 65 y.o. female coming in with complaint of B knee pain. Patient is here for Synvisc injections. Patient states that she has not been hiking due to laryngitis but was having pain with her hikes prior to getting sick.   Plantar wart treated and this has helped to alleviate her foot pain.   Also complaining of L shoulder pain. Pain in posterior aspect that is nerve like. Celebrex is helping. Unable to sleep due to pain.  Patient states it is fairly severe with 7 out of 10 pain.  Patient states that it is more of a throbbing aching pain at all time with sharp pain with certain movements.  Patient did have a contralateral rotator cuff tear but states that this feels significantly different.  Would describe it more of a nerve pain she states.      Past Medical History:  Diagnosis Date   Anxiety    Osteoarthritis    PONV (postoperative nausea and vomiting)    had emesis  after surgery when went home   Serum calcium elevated    Vaginal atrophy    Past Surgical History:  Procedure Laterality Date   COLONOSCOPY     FOOT SURGERY     left; on multiple occasions   KNEE ARTHROSCOPY  2001, 2005   left x 2   SHOULDER ARTHROSCOPY WITH ROTATOR CUFF REPAIR AND SUBACROMIAL DECOMPRESSION Right 12/01/2019   Procedure: SHOULDER ARTHROSCOPY WITH ROTATOR CUFF REPAIR, DISTAL CLAVICLE EXCISION;  Surgeon: Jones Broom, MD;  Location: [facility];  Service: Orthopedics;  Laterality: Right;   SUBACROMIAL DECOMPRESSION Right 12/01/2019   Procedure: SUBACROMIAL DECOMPRESSION;  Surgeon: Jones Broom, MD;  Location: [facility];  Service: Orthopedics;  Laterality: Right;   Social History   Socioeconomic History   Marital status: Legally Separated    Spouse name: Not on file   Number of children: 3   Years of education: Not on file   Highest education level: Not on file  Occupational History    Employer: [employer]  Tobacco Use   Smoking status: Never   Smokeless tobacco: Never  Vaping Use   Vaping Use: Never used  Substance and Sexual Activity   Alcohol use: Yes    Comment: 1 drink daily   Drug use: No   Sexual activity: Not on file  Other Topics Concern   Not on file  Social History Narrative   Not on file  Social Determinants of Health   Financial Resource Strain: Not on file  Food Insecurity: Not on file  Transportation Needs: Not on file  Physical Activity: Not on file  Stress: Not on file  Social Connections: Not on file   Allergies  Allergen Reactions   Penicillins     REACTION: hives   Family History  Problem Relation Age of Onset   Heart attack Mother    Heart disease Maternal Grandmother    Alzheimer's disease Father    Colon cancer Neg Hx    Esophageal cancer Neg Hx    Rectal cancer Neg Hx    Stomach cancer Neg Hx     Current Outpatient Medications (Endocrine &  Metabolic):    predniSONE (DELTASONE) 20 MG tablet, Take 2 tablets (40 mg total) by mouth daily with breakfast.    Current Outpatient Medications (Analgesics):    celecoxib (CELEBREX) 200 MG capsule, One to 2 tablets by mouth daily as needed for pain.   ibuprofen (ADVIL,MOTRIN) 200 MG tablet, Take 200 mg by mouth every 6 (six) hours as needed.   Current Outpatient Medications (Other):    cholecalciferol (VITAMIN D3) 25 MCG (1000 UNIT) tablet, Take 1,000 Units by mouth daily.   Diclofenac Sodium (PENNSAID) 2 % SOLN, Place 2 g onto the skin 2 (two) times daily.   gabapentin (NEURONTIN) 100 MG capsule, Take 2 capsules (200 mg total) by mouth at bedtime.   LORazepam (ATIVAN) 0.5 MG tablet, Take 0.5 mg 2 (two) times daily as needed by mouth.   Omega-3 Fatty Acids (FISH OIL PO), Take by mouth.   polyethylene glycol powder (GLYCOLAX/MIRALAX) powder, Take 17 g by mouth daily.   venlafaxine (EFFEXOR) 37.5 MG tablet, Take 37.5 mg by mouth daily.   Reviewed prior external information including notes and imaging from  primary care provider As well as notes that were available from care everywhere and other healthcare systems.  Past medical history, social, surgical and family history all reviewed in electronic medical record.  No pertanent information unless stated regarding to the chief complaint.   Review of Systems:  No headache, visual changes, nausea, vomiting, diarrhea, constipation, dizziness, abdominal pain, skin rash, fevers, chills, night sweats, weight loss, swollen lymph nodes, body aches, joint swelling, chest pain, shortness of breath, mood changes. POSITIVE muscle aches  Objective  Blood pressure 112/82, pulse 67, height 5\' 7"  (1.702 m), weight 138 lb (62.6 kg), SpO2 99 %.   General: No apparent distress alert and oriented x3 mood and affect normal, dressed appropriately.  HEENT: Pupils equal, extraocular movements intact  Respiratory: Patient's speak in full sentences and does  not appear short of breath  Cardiovascular: No lower extremity edema, non tender, no erythema  Antalgic gait noted. Patient does have knee arthritis bilaterally.  Trace effusion noted of the patellofemoral joint on the left compared to the right. Patient does have instability noted with valgus and varus force.  Patient's neck exam does have loss of lordosis.  Mild positive Spurling's with radicular symptoms more in the C7 distribution.  Patient's rotator cuff appears to be intact but does have some voluntary guarding noted.  Good grip strength noted.  Limited muscular skeletal ultrasound was performed and interpreted by , M  Limited musculoskeletal ultrasound of patient's left shoulder shows the patient does have hypoechoic changes of the supraspinatus and the subscapularis that is likely contributing. No acute tears appreciated.  Mild underlying glenohumeral and acromioclavicular arthritis Impression: Mild tendinitis with mild arthritic changes  After informed  written and verbal consent, patient was seated on exam table. Right knee was prepped with alcohol swab and utilizing anterolateral approach, patient's right knee space was injected with 60  mg per 3 mL of Synvisc 1 (sodium hyaluronate) in a prefilled syringe was injected easily into the knee through a 22-gauge needle..Patient tolerated the procedure well without immediate complications.  After informed written and verbal consent, patient was seated on exam table. Left knee was prepped with alcohol swab and utilizing anterolateral approach, patient's left knee space was injected with 60 mg per 3 mL of Synvisc 1 (sodium hyaluronate) in a prefilled syringe was injected easily into the knee through a 22-gauge needle..Patient tolerated the procedure well without immediate complications.    Impression and Recommendations:     The above documentation has been reviewed and is accurate and complete Judi Saa, DO

## 2021-06-29 ENCOUNTER — Encounter: Payer: Self-pay | Admitting: Family Medicine

## 2021-06-29 ENCOUNTER — Ambulatory Visit (INDEPENDENT_AMBULATORY_CARE_PROVIDER_SITE_OTHER): Payer: BC Managed Care – PPO | Admitting: Family Medicine

## 2021-06-29 ENCOUNTER — Ambulatory Visit: Payer: Self-pay

## 2021-06-29 ENCOUNTER — Other Ambulatory Visit: Payer: Self-pay

## 2021-06-29 VITALS — BP 112/82 | HR 67 | Ht 67.0 in | Wt 138.0 lb

## 2021-06-29 DIAGNOSIS — M5412 Radiculopathy, cervical region: Secondary | ICD-10-CM | POA: Diagnosis not present

## 2021-06-29 DIAGNOSIS — M170 Bilateral primary osteoarthritis of knee: Secondary | ICD-10-CM

## 2021-06-29 DIAGNOSIS — M25512 Pain in left shoulder: Secondary | ICD-10-CM | POA: Diagnosis not present

## 2021-06-29 MED ORDER — GABAPENTIN 100 MG PO CAPS: 200.0000 mg | ORAL_CAPSULE | Freq: Every day | ORAL | 0 refills | Status: DC

## 2021-06-29 MED ORDER — PREDNISONE 20 MG PO TABS: 40.0000 mg | ORAL_TABLET | Freq: Every day | ORAL | 0 refills | Status: DC

## 2021-06-29 NOTE — Patient Instructions (Signed)
Exercises Pred 40mg  5 days Gabapentin 200mg  at nighty See me again in 5 weeks

## 2021-06-29 NOTE — Assessment & Plan Note (Signed)
Chronic problem with exacerbation.  I do expect patient to hopefully make some significant progress.  Discussed icing regimen and home exercises.  Discussed monitoring for any other type of signs and symptoms of increasing instability.  Patient wants to avoid any type of intervention such as surgical.  Follow-up with me again in 2 months.

## 2021-06-29 NOTE — Assessment & Plan Note (Signed)
Seems to have more of a cervical radiculopathy.  Discussed with patient about the shoulder being also a possibility here with the concern.  Very mild hypoechoic changes on ultrasound consistent with some mild subacromial bursitis.  Do not feel that this is more of a rotator cuff tear and more likely secondary to the cervical radiculopathy

## 2021-06-30 DIAGNOSIS — F419 Anxiety disorder, unspecified: Secondary | ICD-10-CM | POA: Diagnosis not present

## 2021-07-11 DIAGNOSIS — B079 Viral wart, unspecified: Secondary | ICD-10-CM | POA: Diagnosis not present

## 2021-07-19 DIAGNOSIS — F419 Anxiety disorder, unspecified: Secondary | ICD-10-CM | POA: Diagnosis not present

## 2021-08-02 NOTE — Progress Notes (Signed)
[facility] [address] [place] [address] Phone: [phone] Subjective:   Kaitlyn Dominguez, am serving as a scribe for Dr. Antoine Primas. This visit occurred during the SARS-CoV-2 public health emergency.  Safety protocols were in place, including screening questions prior to the visit, additional usage of staff PPE, and extensive cleaning of exam room while observing appropriate contact time as indicated for disinfecting solutions.  I'm seeing this patient by the request  of:  Laurann Montana, MD  CC: Bilateral knee pain.,  Neck pain and shoulder pain follow-up  VVO:[id number]  06/29/2021 Seems to have more of a cervical radiculopathy.  Discussed with patient about the shoulder being also a possibility here with the concern.  Very mild hypoechoic changes on ultrasound consistent with some mild subacromial bursitis.  Do not feel that this is more of a rotator cuff tear and more likely secondary to the cervical radiculopathy  Chronic problem with exacerbation.  I do expect patient to hopefully make some significant progress.  Discussed icing regimen and home exercises.  Discussed monitoring for any other type of signs and symptoms of increasing instability.  Patient wants to avoid any type of intervention such as surgical.  Follow-up with me again in 2 months.  Update 08/03/2021 Kaitlyn Dominguez is a 65 y.o. female coming in with complaint of L shoulder, neck and B knee pain. Patient states that shoulder is improving. Not able to put on jacket without pain. R knee pain continues. Has sharp pain with sit to stand over lateral aspect of R knee as well as with walking. Able to do yoga but is unable to knee down in class.  Patient states that this is a different pain than what it was previously.  Denies any significant instability.       Past Medical History:  Diagnosis Date   Anxiety    Osteoarthritis    PONV (postoperative nausea and vomiting)    had  emesis after surgery when went home   Serum calcium elevated    Vaginal atrophy    Past Surgical History:  Procedure Laterality Date   COLONOSCOPY     FOOT SURGERY     left; on multiple occasions   KNEE ARTHROSCOPY  2001, 2005   left x 2   SHOULDER ARTHROSCOPY WITH ROTATOR CUFF REPAIR AND SUBACROMIAL DECOMPRESSION Right 12/01/2019   Procedure: SHOULDER ARTHROSCOPY WITH ROTATOR CUFF REPAIR, DISTAL CLAVICLE EXCISION;  Surgeon: Jones Broom, MD;  Location: [facility];  Service: Orthopedics;  Laterality: Right;   SUBACROMIAL DECOMPRESSION Right 12/01/2019   Procedure: SUBACROMIAL DECOMPRESSION;  Surgeon: Jones Broom, MD;  Location: [facility];  Service: Orthopedics;  Laterality: Right;   Social History   Socioeconomic History   Marital status: Legally Separated    Spouse name: Not on file   Number of children: 3   Years of education: Not on file   Highest education level: Not on file  Occupational History    Employer: [employer]  Tobacco Use   Smoking status: Never   Smokeless tobacco: Never  Vaping Use   Vaping Use: Never used  Substance and Sexual Activity   Alcohol use: Yes    Comment: 1 drink daily   Drug use: No   Sexual activity: Not on file  Other Topics Concern   Not on file  Social History Narrative   Not on file   Social Determinants of Health   Financial Resource  Strain: Not on file  Food Insecurity: Not on file  Transportation Needs: Not on file  Physical Activity: Not on file  Stress: Not on file  Social Connections: Not on file   Allergies  Allergen Reactions   Penicillins     REACTION: hives   Family History  Problem Relation Age of Onset   Heart attack Mother    Heart disease Maternal Grandmother    Alzheimer's disease Father    Colon cancer Neg Hx    Esophageal cancer Neg Hx    Rectal cancer Neg Hx    Stomach cancer Neg Hx     Current Outpatient Medications (Endocrine &  Metabolic):    predniSONE (DELTASONE) 20 MG tablet, Take 2 tablets (40 mg total) by mouth daily with breakfast.    Current Outpatient Medications (Analgesics):    celecoxib (CELEBREX) 200 MG capsule, One to 2 tablets by mouth daily as needed for pain.   ibuprofen (ADVIL,MOTRIN) 200 MG tablet, Take 200 mg by mouth every 6 (six) hours as needed.   Current Outpatient Medications (Other):    cholecalciferol (VITAMIN D3) 25 MCG (1000 UNIT) tablet, Take 1,000 Units by mouth daily.   Diclofenac Sodium (PENNSAID) 2 % SOLN, Place 2 g onto the skin 2 (two) times daily.   gabapentin (NEURONTIN) 100 MG capsule, Take 2 capsules (200 mg total) by mouth at bedtime.   LORazepam (ATIVAN) 0.5 MG tablet, Take 0.5 mg 2 (two) times daily as needed by mouth.   Omega-3 Fatty Acids (FISH OIL PO), Take by mouth.   polyethylene glycol powder (GLYCOLAX/MIRALAX) powder, Take 17 g by mouth daily.   venlafaxine (EFFEXOR) 37.5 MG tablet, Take 37.5 mg by mouth daily.   Reviewed prior external information including notes and imaging from  primary care provider As well as notes that were available from care everywhere and other healthcare systems.  Past medical history, social, surgical and family history all reviewed in electronic medical record.  No pertanent information unless stated regarding to the chief complaint.   Review of Systems:  No headache, visual changes, nausea, vomiting, diarrhea, constipation, dizziness, abdominal pain, skin rash, fevers, chills, night sweats, weight loss, swollen lymph nodes, body aches, joint swelling, chest pain, shortness of breath, mood changes. POSITIVE muscle aches  Objective  Blood pressure 110/76, pulse 64, height 5\' 7"  (1.702 m), weight 133 lb (60.3 kg), SpO2 99 %.   General: No apparent distress alert and oriented x3 mood and affect normal, dressed appropriately.  HEENT: Pupils equal, extraocular movements intact  Respiratory: Patient's speak in full sentences and does  not appear short of breath  Cardiovascular: No lower extremity edema, non tender, no erythema  Gait normal with good balance and coordination.  MSK: Patient's right knee does have some mild lateral tracking noted.  Negative McMurray's.  Patient has very mild instability noted with valgus and varus force. Left shoulder exam has improvement in range of motion.  Very mild positive impingement with Neer and Hawkins.  Patient's neck exam still has some limited range of motion especially with extension of the neck.   Limited muscular skeletal ultrasound was performed and interpreted by , M  Limited ultrasound of patient's right knee shows no significant hypoechoic changes consistent with swelling.  Patient noted does have narrowing of the patellofemoral joint noted.  Patient does have significant CPPD distribution no in the meniscus medially and laterally.   Impression and Recommendations:     The above documentation has been reviewed and is accurate and  complete Lyndal Pulley, DO

## 2021-08-03 ENCOUNTER — Encounter: Payer: Self-pay | Admitting: Family Medicine

## 2021-08-03 ENCOUNTER — Ambulatory Visit: Payer: Self-pay

## 2021-08-03 ENCOUNTER — Other Ambulatory Visit: Payer: Self-pay

## 2021-08-03 ENCOUNTER — Ambulatory Visit (INDEPENDENT_AMBULATORY_CARE_PROVIDER_SITE_OTHER): Payer: BC Managed Care – PPO

## 2021-08-03 ENCOUNTER — Ambulatory Visit (INDEPENDENT_AMBULATORY_CARE_PROVIDER_SITE_OTHER): Payer: BC Managed Care – PPO | Admitting: Family Medicine

## 2021-08-03 VITALS — BP 110/76 | HR 64 | Ht 67.0 in | Wt 133.0 lb

## 2021-08-03 DIAGNOSIS — M25512 Pain in left shoulder: Secondary | ICD-10-CM | POA: Diagnosis not present

## 2021-08-03 DIAGNOSIS — M1120 Other chondrocalcinosis, unspecified site: Secondary | ICD-10-CM

## 2021-08-03 DIAGNOSIS — M19012 Primary osteoarthritis, left shoulder: Secondary | ICD-10-CM | POA: Diagnosis not present

## 2021-08-03 DIAGNOSIS — G8929 Other chronic pain: Secondary | ICD-10-CM

## 2021-08-03 DIAGNOSIS — M5412 Radiculopathy, cervical region: Secondary | ICD-10-CM | POA: Diagnosis not present

## 2021-08-03 DIAGNOSIS — M170 Bilateral primary osteoarthritis of knee: Secondary | ICD-10-CM

## 2021-08-03 NOTE — Assessment & Plan Note (Signed)
Patient likely does not have significant radicular symptoms at this time.  We will get x-rays of the cervical and left shoulder today to further evaluate.  Patient had improvement in range of motion as well as strength.

## 2021-08-03 NOTE — Assessment & Plan Note (Addendum)
Bilaterally but mild overall.  Do think CPPD is still playing a role.  We will continue to monitor.  We discussed the potential for another injection which patient declined at the moment.  Patient did not have significant amount of hypoechoic changes on ultrasound.  Patient has been fairly noncompliant on the CPPD treatment.

## 2021-08-03 NOTE — Assessment & Plan Note (Signed)
Still believe that the underlying CPPD is likely contributing.  Patient still wants to stay away from any type of prescription medication for this if possible.  We discussed with patient about icing regimen and home exercises.  Discussed the tart cherry extract.  Increase activity slowly.  I believe that patient can continue to increase activity and warranted for the knee.  Worsening pain MRI would be necessary.  Follow-up again in 6 weeks

## 2021-08-03 NOTE — Patient Instructions (Addendum)
Xray today Knee okay to continue to be active Take tart cherry 1200mg  at night Pennsaid as needed See you again in 6 weeks

## 2021-08-16 DIAGNOSIS — F419 Anxiety disorder, unspecified: Secondary | ICD-10-CM | POA: Diagnosis not present

## 2021-08-16 DIAGNOSIS — B070 Plantar wart: Secondary | ICD-10-CM | POA: Diagnosis not present

## 2021-08-16 DIAGNOSIS — B351 Tinea unguium: Secondary | ICD-10-CM | POA: Diagnosis not present

## 2021-08-16 DIAGNOSIS — L821 Other seborrheic keratosis: Secondary | ICD-10-CM | POA: Diagnosis not present

## 2021-08-16 DIAGNOSIS — L814 Other melanin hyperpigmentation: Secondary | ICD-10-CM | POA: Diagnosis not present

## 2021-09-08 DIAGNOSIS — F419 Anxiety disorder, unspecified: Secondary | ICD-10-CM | POA: Diagnosis not present

## 2021-09-23 NOTE — Progress Notes (Signed)
[name] [address] [place] [address] Phone: [phone] Subjective:   Kaitlyn Dominguez, am serving as a scribe for Dr. Antoine Primas. This visit occurred during the SARS-CoV-2 public health emergency.  Safety protocols were in place, including screening questions prior to the visit, additional usage of staff PPE, and extensive cleaning of exam room while observing appropriate contact time as indicated for disinfecting solutions.   I'm seeing this patient by the request  of:  Laurann Montana, MD  CC: left shoulder pain follow up   [id number]  08/03/2021 Patient likely does not have significant radicular symptoms at this time.  We will get x-rays of the cervical and left shoulder today to further evaluate.  Patient had improvement in range of motion as well as strength.  Bilaterally but mild overall.  Do think CPPD is still playing a role.  We will continue to monitor.  We discussed the potential for another injection which patient declined at the moment.  Patient did not have significant amount of hypoechoic changes on ultrasound.  Patient has been fairly noncompliant on the CPPD treatment.  Still believe that the underlying CPPD is likely contributing.  Patient still wants to stay away from any type of prescription medication for this if possible.  We discussed with patient about icing regimen and home exercises.  Discussed the tart cherry extract.  Increase activity slowly.  I believe that patient can continue to increase activity and warranted for the knee.  Worsening pain MRI would be necessary.  Follow-up again in 6 weeks  Update 09/26/2021 Kaitlyn Dominguez is a 66 y.o. female coming in with complaint of L shoulder and B knee pain. Patient states shoulder is doing well. No pain today. Knee pain still persistent. Hasn't been over active so the rest is helping with the pain. No new complaints.   Xray L shoulder 08/03/2021 IMPRESSION: Mild  degenerative change without acute abnormality. Past Medical History:  Diagnosis Date   Anxiety    Osteoarthritis    PONV (postoperative nausea and vomiting)    had emesis after surgery when went home   Serum calcium elevated    Vaginal atrophy    Past Surgical History:  Procedure Laterality Date   COLONOSCOPY     FOOT SURGERY     left; on multiple occasions   KNEE ARTHROSCOPY  2001, 2005   left x 2   SHOULDER ARTHROSCOPY WITH ROTATOR CUFF REPAIR AND SUBACROMIAL DECOMPRESSION Right 12/01/2019   Procedure: SHOULDER ARTHROSCOPY WITH ROTATOR CUFF REPAIR, DISTAL CLAVICLE EXCISION;  Surgeon: Jones Broom, MD;  Location: [facility];  Service: Orthopedics;  Laterality: Right;   SUBACROMIAL DECOMPRESSION Right 12/01/2019   Procedure: SUBACROMIAL DECOMPRESSION;  Surgeon: Jones Broom, MD;  Location: [facility];  Service: Orthopedics;  Laterality: Right;   Social History   Socioeconomic History   Marital status: Legally Separated    Spouse name: Not on file   Number of children: 3   Years of education: Not on file   Highest education level: Not on file  Occupational History    Employer: [employer]  Tobacco Use   Smoking status: Never   Smokeless tobacco: Never  Vaping Use   Vaping Use: Never used  Substance and Sexual Activity   Alcohol use: Yes    Comment: 1 drink daily   Drug use: No   Sexual activity: Not on file  Other Topics Concern   Not on  file  Social History Narrative   Not on file   Social Determinants of Health   Financial Resource Strain: Not on file  Food Insecurity: Not on file  Transportation Needs: Not on file  Physical Activity: Not on file  Stress: Not on file  Social Connections: Not on file   Allergies  Allergen Reactions   Penicillins     REACTION: hives   Family History  Problem Relation Age of Onset   Heart attack Mother    Heart disease Maternal Grandmother    Alzheimer's  disease Father    Colon cancer Neg Hx    Esophageal cancer Neg Hx    Rectal cancer Neg Hx    Stomach cancer Neg Hx     Current Outpatient Medications (Endocrine & Metabolic):    predniSONE (DELTASONE) 20 MG tablet, Take 2 tablets (40 mg total) by mouth daily with breakfast.    Current Outpatient Medications (Analgesics):    colchicine 0.6 MG tablet, Take 1 tablet (0.6 mg total) by mouth 2 (two) times daily.   celecoxib (CELEBREX) 200 MG capsule, One to 2 tablets by mouth daily as needed for pain.   ibuprofen (ADVIL,MOTRIN) 200 MG tablet, Take 200 mg by mouth every 6 (six) hours as needed.   Current Outpatient Medications (Other):    cholecalciferol (VITAMIN D3) 25 MCG (1000 UNIT) tablet, Take 1,000 Units by mouth daily.   Diclofenac Sodium (PENNSAID) 2 % SOLN, Place 2 g onto the skin 2 (two) times daily.   gabapentin (NEURONTIN) 100 MG capsule, Take 2 capsules (200 mg total) by mouth at bedtime.   LORazepam (ATIVAN) 0.5 MG tablet, Take 0.5 mg 2 (two) times daily as needed by mouth.   Omega-3 Fatty Acids (FISH OIL PO), Take by mouth.   polyethylene glycol powder (GLYCOLAX/MIRALAX) powder, Take 17 g by mouth daily.   venlafaxine (EFFEXOR) 37.5 MG tablet, Take 37.5 mg by mouth daily.   Reviewed prior external information including notes and imaging from  primary care provider As well as notes that were available from care everywhere and other healthcare systems.  Past medical history, social, surgical and family history all reviewed in electronic medical record.  No pertanent information unless stated regarding to the chief complaint.   Review of Systems:  No headache, visual changes, nausea, vomiting, diarrhea, constipation, dizziness, abdominal pain, skin rash, fevers, chills, night sweats, weight loss, swollen lymph nodes, body aches, joint swelling, chest pain, shortness of breath, mood changes. POSITIVE muscle aches  Objective  Blood pressure 110/72, pulse 76, height 5\' 7"   (1.702 m), weight 140 lb (63.5 kg), SpO2 99 %.   General: No apparent distress alert and oriented x3 mood and affect normal, dressed appropriately.  HEENT: Pupils equal, extraocular movements intact  Respiratory: Patient's speak in full sentences and does not appear short of breath  Cardiovascular: No lower extremity edema, non tender, no erythema  Gait mildly antalgic Right knee still has some tenderness to palpation over the medial joint line trace fluid noted as well.  Lacks last 5 degrees of flexion of the knee  Limited muscular skeletal ultrasound was performed and interpreted by , M  Lateral meniscus does have some degenerative changes noted.  Patient does have significant CPPD deposition in the medial and lateral meniscus.  Mild to moderate narrowing of the patellofemoral joint noted as well. 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

## 2021-09-26 ENCOUNTER — Encounter: Payer: Self-pay | Admitting: Family Medicine

## 2021-09-26 ENCOUNTER — Ambulatory Visit (INDEPENDENT_AMBULATORY_CARE_PROVIDER_SITE_OTHER): Payer: BC Managed Care – PPO | Admitting: Family Medicine

## 2021-09-26 ENCOUNTER — Other Ambulatory Visit: Payer: Self-pay

## 2021-09-26 ENCOUNTER — Ambulatory Visit: Payer: Self-pay

## 2021-09-26 VITALS — BP 110/72 | HR 76 | Ht 67.0 in | Wt 140.0 lb

## 2021-09-26 DIAGNOSIS — M25512 Pain in left shoulder: Secondary | ICD-10-CM

## 2021-09-26 DIAGNOSIS — G8929 Other chronic pain: Secondary | ICD-10-CM

## 2021-09-26 DIAGNOSIS — M170 Bilateral primary osteoarthritis of knee: Secondary | ICD-10-CM | POA: Diagnosis not present

## 2021-09-26 DIAGNOSIS — M1120 Other chondrocalcinosis, unspecified site: Secondary | ICD-10-CM

## 2021-09-26 MED ORDER — COLCHICINE 0.6 MG PO TABS: 0.6000 mg | ORAL_TABLET | Freq: Two times a day (BID) | ORAL | 0 refills | Status: DC

## 2021-09-26 NOTE — Patient Instructions (Addendum)
Prescription ordered Send message in a week to tell us how you're doing Continue all other therapy See you again in 6 weeks, we'll get approval for gel after that appointment

## 2021-09-26 NOTE — Assessment & Plan Note (Signed)
Known CPPD as well as arthritic changes of the knee noted.  Discussed icing regimen and home exercises, discussed which activities discussed the potential for advanced imaging as well for the knee.  A monitor.  We will follow-up again in 2 to 3 months.

## 2021-09-26 NOTE — Assessment & Plan Note (Addendum)
Known CPPD as well as arthritic changes of the knee noted.  Discussed icing regimen and home exercises, discussed which activities discussed the potential for advanced imaging as well for the knee.  A monitor.  We will follow-up again in 2 to 3 months.  We will do a trial of also colchicine to see how patient responds

## 2021-10-18 ENCOUNTER — Encounter: Payer: Self-pay | Admitting: Family Medicine

## 2021-10-20 DIAGNOSIS — F419 Anxiety disorder, unspecified: Secondary | ICD-10-CM | POA: Diagnosis not present

## 2021-11-02 IMAGING — DX DG SHOULDER 2+V*R*
3 series · 3 of 3 positions shown · non-contrast
Comparison: None.

CLINICAL DATA: Right shoulder pain for 10 days. No recent injury.
History of rotator cuff tear.

EXAM:
RIGHT SHOULDER - 2+ VIEW

[shoulder (grashey view) ap]
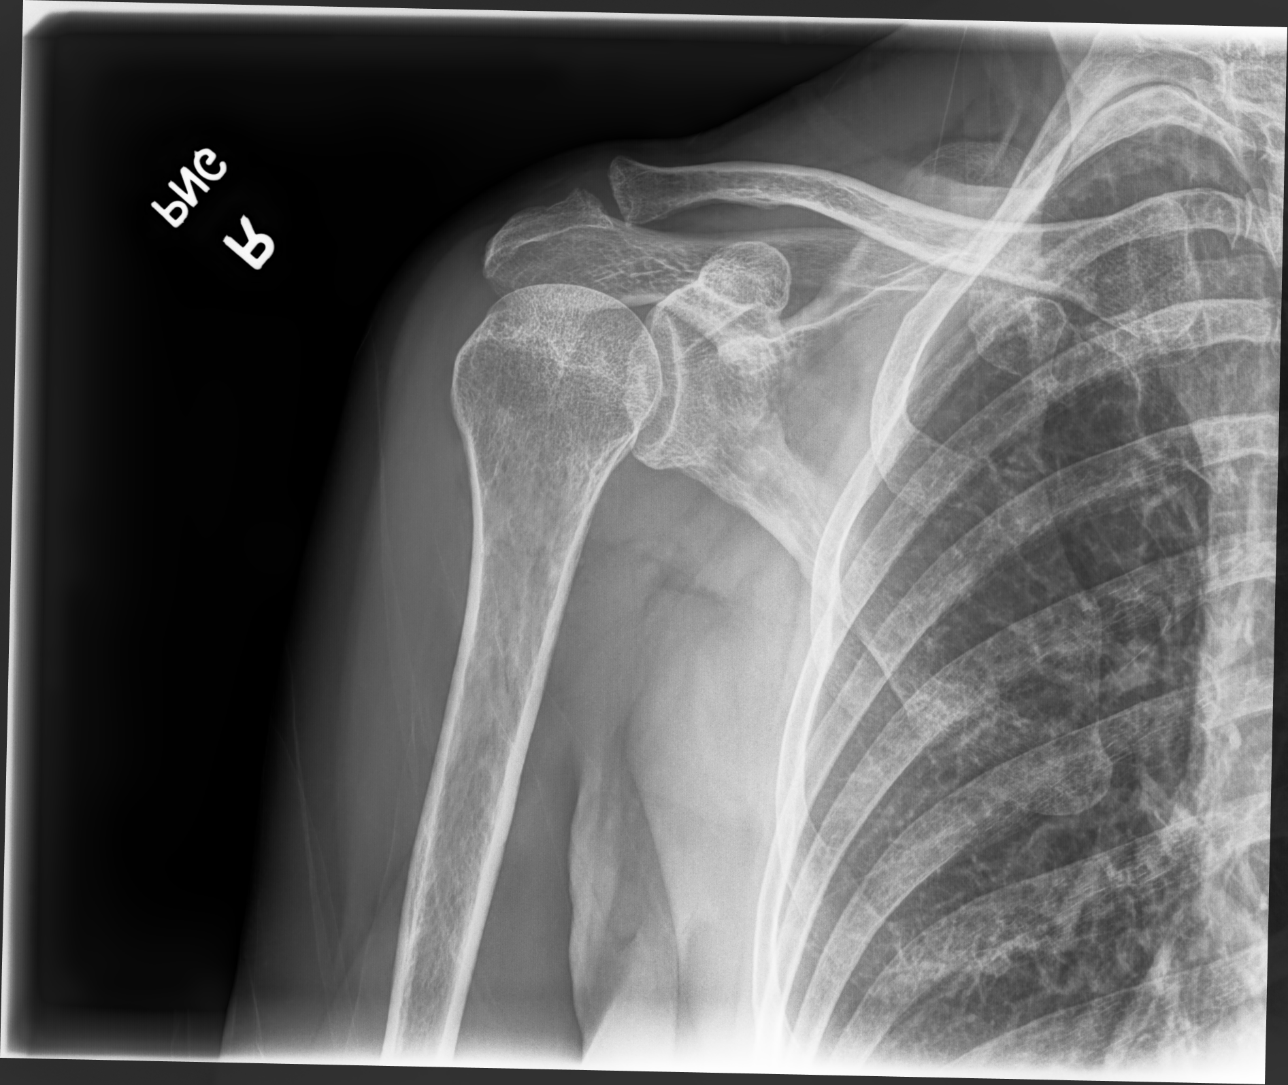

[shoulder (y view) lat]
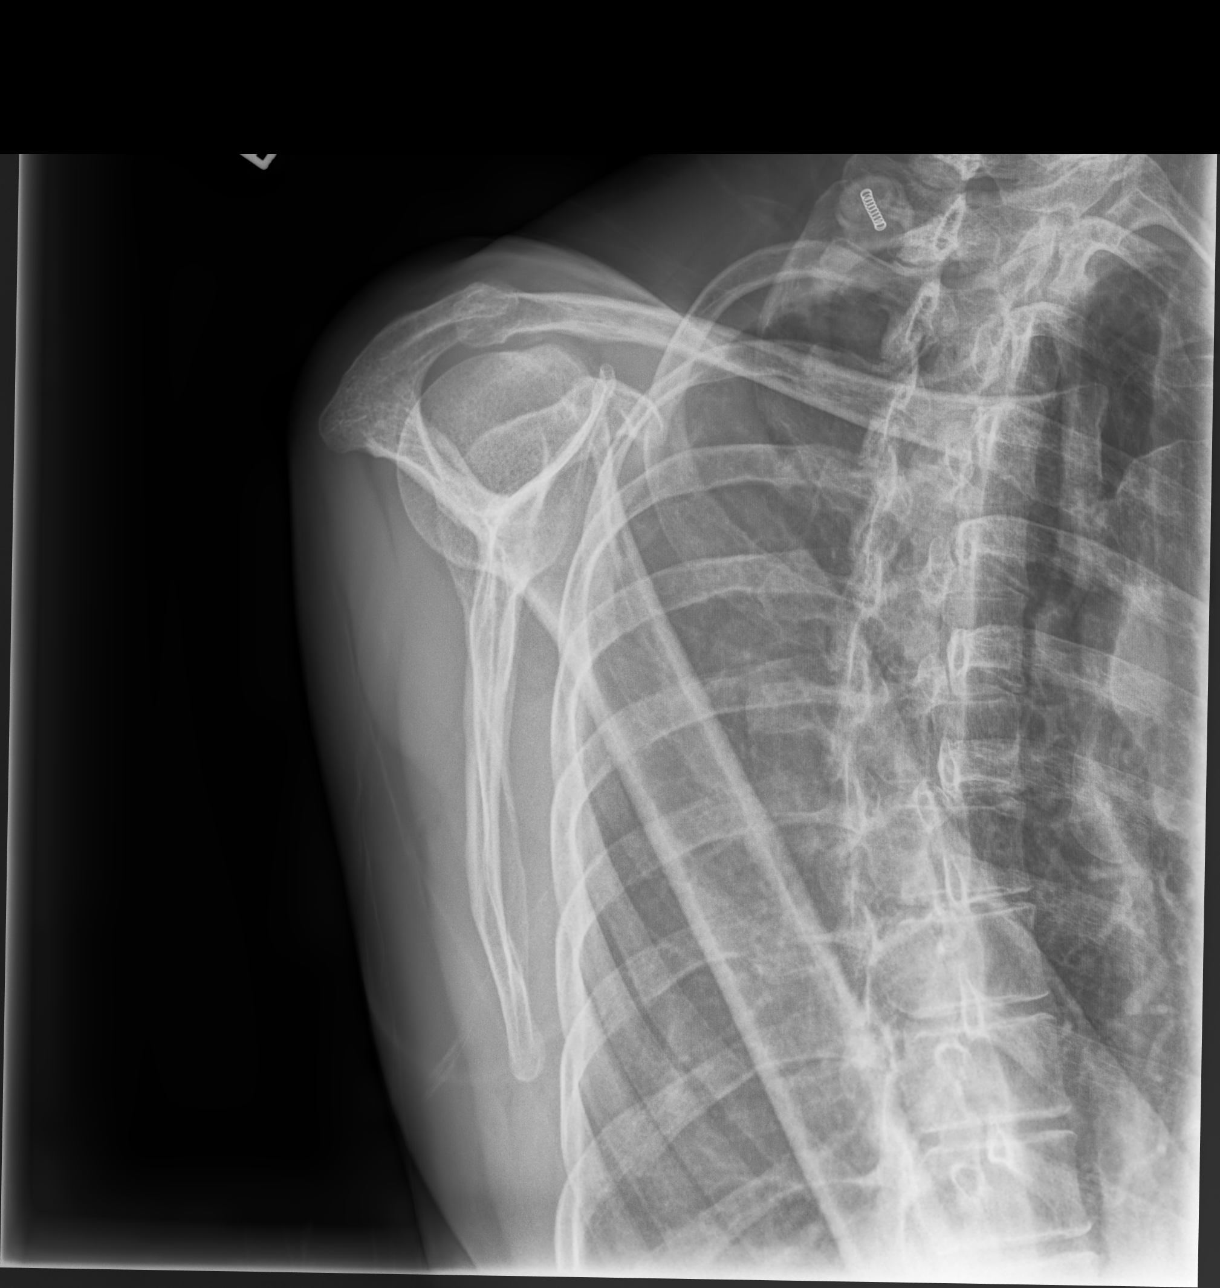

[shoulder axial 45°]
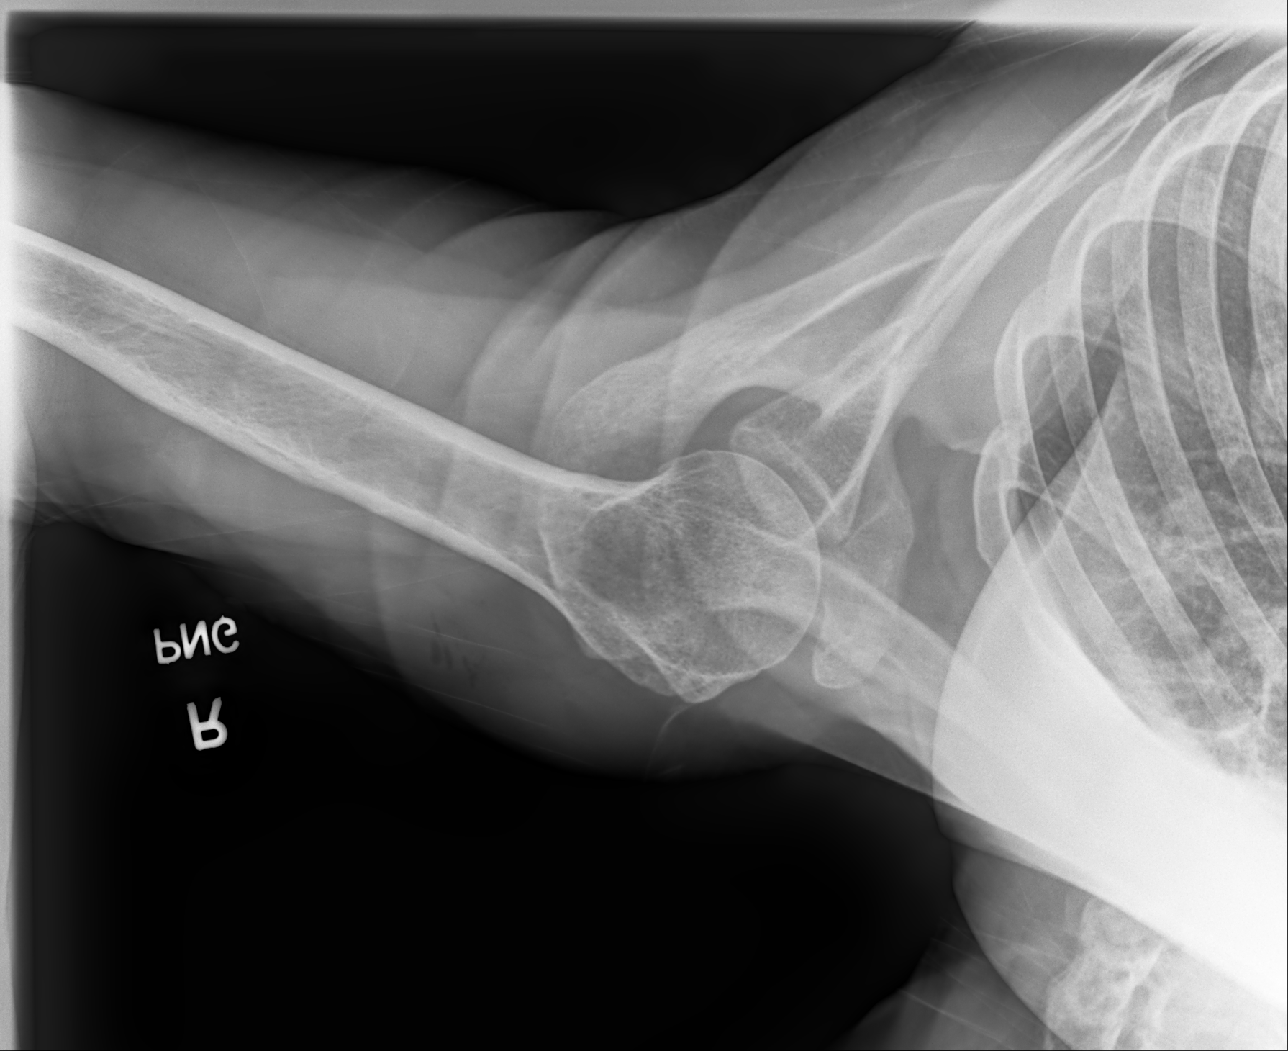

[3 of 3 positions shown; findings below may reference images not displayed]

FINDINGS: The bones appear mildly demineralized. There is no evidence of acute
fracture or dislocation. There are mild acromioclavicular and
glenohumeral degenerative changes. The subacromial space is
adequately preserved.
IMPRESSION: Mild degenerative changes in the glenohumeral and acromioclavicular
joints. No acute osseous findings.

## 2021-11-04 NOTE — Progress Notes (Deleted)
?[name] D.O. ?[facility] ?[address] [place] [address] ?Phone: [phone] ?Subjective:   ? ?I'm seeing this patient by the request  of:  Laurann Montana, MD ? ?CC: left shoulder pain  ? ?[id number]  ?09/26/2021 ?Known CPPD as well as arthritic changes of the knee noted.  Discussed icing regimen and home exercises, discussed which activities discussed the potential for advanced imaging as well for the knee.  A monitor.  We will follow-up again in 2 to 3 months. ? ?Update 11/07/2021 ?Kaitlyn Dominguez is a 66 y.o. female coming in with complaint of L shoulder and B knee pain. Patient states  ? ? ? ?  ? ?Past Medical History:  ?Diagnosis Date  ? Anxiety   ? Osteoarthritis   ? PONV (postoperative nausea and vomiting)   ? had emesis after surgery when went home  ? Serum calcium elevated   ? Vaginal atrophy   ? ?Past Surgical History:  ?Procedure Laterality Date  ? COLONOSCOPY    ? FOOT SURGERY    ? left; on multiple occasions  ? KNEE ARTHROSCOPY  2001, 2005  ? left x 2  ? SHOULDER ARTHROSCOPY WITH ROTATOR CUFF REPAIR AND SUBACROMIAL DECOMPRESSION Right 12/01/2019  ? Procedure: SHOULDER ARTHROSCOPY WITH ROTATOR CUFF REPAIR, DISTAL CLAVICLE EXCISION;  Surgeon: Jones Broom, MD;  Location: [facility];  Service: Orthopedics;  Laterality: Right;  ? SUBACROMIAL DECOMPRESSION Right 12/01/2019  ? Procedure: SUBACROMIAL DECOMPRESSION;  Surgeon: Jones Broom, MD;  Location: [facility];  Service: Orthopedics;  Laterality: Right;  ? ?Social History  ? ?Socioeconomic History  ? Marital status: Legally Separated  ?  Spouse name: Not on file  ? Number of children: 3  ? Years of education: Not on file  ? Highest education level: Not on file  ?Occupational History  ?  Employer: [employer]  ?Tobacco Use  ? Smoking status: Never  ? Smokeless tobacco: Never  ?Vaping Use  ? Vaping Use: Never used  ?Substance and Sexual Activity  ? Alcohol use: Yes   ?  Comment: 1 drink daily  ? Drug use: No  ? Sexual activity: Not on file  ?Other Topics Concern  ? Not on file  ?Social History Narrative  ? Not on file  ? ?Social Determinants of Health  ? ?Financial Resource Strain: Not on file  ?Food Insecurity: Not on file  ?Transportation Needs: Not on file  ?Physical Activity: Not on file  ?Stress: Not on file  ?Social Connections: Not on file  ? ?Allergies  ?Allergen Reactions  ? Penicillins   ?  REACTION: hives  ? ?Family History  ?Problem Relation Age of Onset  ? Heart attack Mother   ? Heart disease Maternal Grandmother   ? Alzheimer's disease Father   ? Colon cancer Neg Hx   ? Esophageal cancer Neg Hx   ? Rectal cancer Neg Hx   ? Stomach cancer Neg Hx   ? ? ?Current Outpatient Medications (Endocrine & Metabolic):  ?  predniSONE (DELTASONE) 20 MG tablet, Take 2 tablets (40 mg total) by mouth daily with breakfast. ? ? ? ?Current Outpatient Medications (Analgesics):  ?  celecoxib (CELEBREX) 200 MG capsule, One to 2 tablets by mouth daily as needed for pain. ?  colchicine 0.6 MG tablet, Take 1 tablet (0.6 mg total) by mouth 2 (two) times daily. ?  ibuprofen (ADVIL,MOTRIN) 200 MG tablet, Take 200 mg by mouth every 6 (six) hours as needed. ? ? ?  Current Outpatient Medications (Other):  ?  cholecalciferol (VITAMIN D3) 25 MCG (1000 UNIT) tablet, Take 1,000 Units by mouth daily. ?  Diclofenac Sodium (PENNSAID) 2 % SOLN, Place 2 g onto the skin 2 (two) times daily. ?  gabapentin (NEURONTIN) 100 MG capsule, Take 2 capsules (200 mg total) by mouth at bedtime. ?  LORazepam (ATIVAN) 0.5 MG tablet, Take 0.5 mg 2 (two) times daily as needed by mouth. ?  Omega-3 Fatty Acids (FISH OIL PO), Take by mouth. ?  polyethylene glycol powder (GLYCOLAX/MIRALAX) powder, Take 17 g by mouth daily. ?  venlafaxine (EFFEXOR) 37.5 MG tablet, Take 37.5 mg by mouth daily. ? ? ?Reviewed prior external information including notes and imaging from  ?primary care provider ?As well as notes that were available  from care everywhere and other healthcare systems. ? ?Past medical history, social, surgical and family history all reviewed in electronic medical record.  No pertanent information unless stated regarding to the chief complaint.  ? ?Review of Systems: ? No headache, visual changes, nausea, vomiting, diarrhea, constipation, dizziness, abdominal pain, skin rash, fevers, chills, night sweats, weight loss, swollen lymph nodes, body aches, joint swelling, chest pain, shortness of breath, mood changes. POSITIVE muscle aches ? ?Objective  ?There were no vitals taken for this visit. ?  ?General: No apparent distress alert and oriented x3 mood and affect normal, dressed appropriately.  ?HEENT: Pupils equal, extraocular movements intact  ?Respiratory: Patient's speak in full sentences and does not appear short of breath  ?Cardiovascular: No lower extremity edema, non tender, no erythema  ?Gait normal with good balance and coordination.  ?MSK:   ? ?  ?Impression and Recommendations:  ?  ?The above documentation has been reviewed and is accurate and complete Judi Saa, DO ? ? ? ?

## 2021-11-06 ENCOUNTER — Observation Stay (HOSPITAL_BASED_OUTPATIENT_CLINIC_OR_DEPARTMENT_OTHER)
Admission: EM | Admit: 2021-11-06 | Discharge: 2021-11-08 | Disposition: A | Discharge status: Home / Self Care | Payer: BC Managed Care – PPO | Attending: General Surgery | Admitting: General Surgery

## 2021-11-06 ENCOUNTER — Other Ambulatory Visit: Payer: Self-pay

## 2021-11-06 ENCOUNTER — Encounter (HOSPITAL_BASED_OUTPATIENT_CLINIC_OR_DEPARTMENT_OTHER): Payer: Self-pay

## 2021-11-06 ENCOUNTER — Emergency Department (HOSPITAL_BASED_OUTPATIENT_CLINIC_OR_DEPARTMENT_OTHER): Payer: BC Managed Care – PPO

## 2021-11-06 DIAGNOSIS — K4030 Unilateral inguinal hernia, with obstruction, without gangrene, not specified as recurrent: Secondary | ICD-10-CM | POA: Diagnosis not present

## 2021-11-06 DIAGNOSIS — Z79899 Other long term (current) drug therapy: Secondary | ICD-10-CM | POA: Insufficient documentation

## 2021-11-06 DIAGNOSIS — R1030 Lower abdominal pain, unspecified: Secondary | ICD-10-CM | POA: Diagnosis not present

## 2021-11-06 DIAGNOSIS — R109 Unspecified abdominal pain: Secondary | ICD-10-CM | POA: Diagnosis not present

## 2021-11-06 DIAGNOSIS — K56609 Unspecified intestinal obstruction, unspecified as to partial versus complete obstruction: Secondary | ICD-10-CM

## 2021-11-06 DIAGNOSIS — K4090 Unilateral inguinal hernia, without obstruction or gangrene, not specified as recurrent: Secondary | ICD-10-CM | POA: Insufficient documentation

## 2021-11-06 DIAGNOSIS — K4130 Unilateral femoral hernia, with obstruction, without gangrene, not specified as recurrent: Secondary | ICD-10-CM | POA: Diagnosis not present

## 2021-11-06 LAB — URINALYSIS, ROUTINE W REFLEX MICROSCOPIC
Bilirubin Urine: NEGATIVE
Glucose, UA: NEGATIVE mg/dL
Ketones, ur: 40 mg/dL — AB
Leukocytes,Ua: NEGATIVE
Nitrite: NEGATIVE
Protein, ur: NEGATIVE mg/dL
Specific Gravity, Urine: 1.017 (ref 1.005–1.030)
pH: 6.5 (ref 5.0–8.0)

## 2021-11-06 LAB — CBC
HCT: 40.9 % (ref 36.0–46.0)
Hemoglobin: 13.4 g/dL (ref 12.0–15.0)
MCH: 27.9 pg (ref 26.0–34.0)
MCHC: 32.8 g/dL (ref 30.0–36.0)
MCV: 85.2 fL (ref 80.0–100.0)
Platelets: 289 10*3/uL (ref 150–400)
RBC: 4.8 MIL/uL (ref 3.87–5.11)
RDW: 12.4 % (ref 11.5–15.5)
WBC: 7.2 10*3/uL (ref 4.0–10.5)
nRBC: 0 % (ref 0.0–0.2)

## 2021-11-06 LAB — COMPREHENSIVE METABOLIC PANEL
ALT: 12 U/L (ref 0–44)
AST: 20 U/L (ref 15–41)
Albumin: 5 g/dL (ref 3.5–5.0)
Alkaline Phosphatase: 59 U/L (ref 38–126)
Anion gap: 11 (ref 5–15)
BUN: 13 mg/dL (ref 8–23)
CO2: 22 mmol/L (ref 22–32)
Calcium: 10.9 mg/dL — ABNORMAL HIGH (ref 8.9–10.3)
Chloride: 101 mmol/L (ref 98–111)
Creatinine, Ser: 0.69 mg/dL (ref 0.44–1.00)
GFR, Estimated: >60 mL/min (ref >60)
Glucose, Bld: 96 mg/dL (ref 70–99)
Potassium: 3.8 mmol/L (ref 3.5–5.1)
Sodium: 134 mmol/L — ABNORMAL LOW (ref 135–145)
Total Bilirubin: 0.6 mg/dL (ref 0.3–1.2)
Total Protein: 7.3 g/dL (ref 6.5–8.1)

## 2021-11-06 LAB — LIPASE, BLOOD: Lipase: 16 U/L (ref 11–51)

## 2021-11-06 LAB — TROPONIN I (HIGH SENSITIVITY): Troponin I (High Sensitivity): <2 ng/L (ref <18)

## 2021-11-06 MED ORDER — ONDANSETRON HCL 4 MG/2ML IJ SOLN: 4.0000 mg | Freq: Once | INTRAMUSCULAR | Status: DC | PRN

## 2021-11-06 MED ORDER — ONDANSETRON HCL 4 MG/2ML IJ SOLN
4.0000 mg | Freq: Once | INTRAMUSCULAR | Status: AC
  Administered 2021-11-06: 4 mg via INTRAVENOUS
  Filled 2021-11-06: qty 2

## 2021-11-06 MED ORDER — MORPHINE SULFATE (PF) 4 MG/ML IV SOLN
4.0000 mg | Freq: Once | INTRAVENOUS | Status: AC
  Administered 2021-11-06: 4 mg via INTRAVENOUS
  Filled 2021-11-06: qty 1

## 2021-11-06 MED ORDER — FENTANYL CITRATE PF 50 MCG/ML IJ SOSY
50.0000 ug | PREFILLED_SYRINGE | Freq: Once | INTRAMUSCULAR | Status: AC
  Administered 2021-11-06: 50 ug via INTRAVENOUS
  Filled 2021-11-06: qty 1

## 2021-11-06 MED ORDER — SODIUM CHLORIDE 0.9 % IV BOLUS
1000.0000 mL | Freq: Once | INTRAVENOUS | Status: AC
  Administered 2021-11-06: 1000 mL via INTRAVENOUS

## 2021-11-06 MED ORDER — IOHEXOL 300 MG/ML  SOLN
100.0000 mL | Freq: Once | INTRAMUSCULAR | Status: AC | PRN
  Administered 2021-11-06: 100 mL via INTRAVENOUS

## 2021-11-06 NOTE — ED Notes (Signed)
Consulting civil engineer at [facility]. ?

## 2021-11-06 NOTE — ED Triage Notes (Signed)
Pt BIB [facility] from [facility] with c/o abdominal pain and SBO. ?

## 2021-11-06 NOTE — ED Provider Notes (Signed)
?[facility] ?Provider Note ? ? ?CSN: [id number] ?Arrival date & time: 11/06/21  1755 ? ?  ? ?History ? ?Chief Complaint  ?Patient presents with  ? Abdominal Pain  ? ? ?Kaitlyn Dominguez is a 65 y.o. female. ? ?Patient is a 66 year old female with past medical history as listed below presenting for complaints of abdominal pain.  Patient admits to generalized abdominal pain, patient, nausea without vomiting.  Also admits to vaginal pressure.  No previous history of bladder or uterus prolapse.  No vaginal bleeding.  No dysuria, hematuria, increased frequency, urgency. ? ?The history is provided by the patient. No language interpreter was used.  ?Abdominal Pain ?Associated symptoms: nausea   ?Associated symptoms: no chest pain, no chills, no cough, no dysuria, no fever, no hematuria, no shortness of breath, no sore throat and no vomiting   ? ?  ? ?Home Medications ?Prior to Admission medications   ?Medication Sig Start Date End Date Taking? Authorizing Provider  ?celecoxib (CELEBREX) 200 MG capsule One to 2 tablets by mouth daily as needed for pain. 11/13/19   Judi Saa, DO  ?cholecalciferol (VITAMIN D3) 25 MCG (1000 UNIT) tablet Take 1,000 Units by mouth daily.    [provider]  ?colchicine 0.6 MG tablet Take 1 tablet (0.6 mg total) by mouth 2 (two) times daily. 09/26/21   Judi Saa, DO  ?Diclofenac Sodium (PENNSAID) 2 % SOLN Place 2 g onto the skin 2 (two) times daily. 08/05/19   Judi Saa, DO  ?gabapentin (NEURONTIN) 100 MG capsule Take 2 capsules (200 mg total) by mouth at bedtime. 06/29/21   Judi Saa, DO  ?ibuprofen (ADVIL,MOTRIN) 200 MG tablet Take 200 mg by mouth every 6 (six) hours as needed.    [provider]  ?LORazepam (ATIVAN) 0.5 MG tablet Take 0.5 mg 2 (two) times daily as needed by mouth. 06/04/17   [provider]  ?Omega-3 Fatty Acids (FISH OIL PO) Take by mouth.    [provider]  ?polyethylene glycol powder  (GLYCOLAX/MIRALAX) powder Take 17 g by mouth daily. 12/20/12   Hart Carwin, MD  ?predniSONE (DELTASONE) 20 MG tablet Take 2 tablets (40 mg total) by mouth daily with breakfast. 06/29/21   Judi Saa, DO  ?venlafaxine (EFFEXOR) 37.5 MG tablet Take 37.5 mg by mouth daily.    [provider]  ?   ? ?Allergies    ?Penicillins   ? ?Review of Systems   ?Review of Systems  ?Constitutional:  Negative for chills and fever.  ?HENT:  Negative for ear pain and sore throat.   ?Eyes:  Negative for pain and visual disturbance.  ?Respiratory:  Negative for cough and shortness of breath.   ?Cardiovascular:  Negative for chest pain and palpitations.  ?Gastrointestinal:  Positive for abdominal pain and nausea. Negative for vomiting.  ?Genitourinary:  Positive for pelvic pain. Negative for dysuria and hematuria.  ?Musculoskeletal:  Negative for arthralgias and back pain.  ?Skin:  Negative for color change and rash.  ?Neurological:  Negative for seizures and syncope.  ?All other systems reviewed and are negative. ? ?Physical Exam ?Updated Vital Signs ?BP 137/77 (BP Location: Left Arm)   Pulse 60   Temp 98.3 ?F (36.8 ?C) (Oral)   Resp 20   SpO2 100%  ?Physical Exam ?Vitals and nursing note reviewed.  ?Constitutional:   ?   General: She is not in acute distress. ?   Appearance: She is well-developed.  ?HENT:  ?  Head: Normocephalic and atraumatic.  ?Eyes:  ?   Conjunctiva/sclera: Conjunctivae normal.  ?Cardiovascular:  ?   Rate and Rhythm: Normal rate and regular rhythm.  ?   Heart sounds: No murmur heard. ?Pulmonary:  ?   Effort: Pulmonary effort is normal. No respiratory distress.  ?   Breath sounds: Normal breath sounds.  ?Abdominal:  ?   Palpations: Abdomen is soft.  ?   Tenderness: There is generalized abdominal tenderness.  ?Musculoskeletal:     ?   General: No swelling.  ?   Cervical back: Neck supple.  ?Skin: ?   General: Skin is warm and dry.  ?   Capillary Refill: Capillary refill takes less than 2 seconds.   ?Neurological:  ?   Mental Status: She is alert.  ?Psychiatric:     ?   Mood and Affect: Mood normal.  ? ? ?ED Results / Procedures / Treatments   ?Labs ?(all labs ordered are listed, but only abnormal results are displayed) ?Labs Reviewed  ?CBC  ?LIPASE, BLOOD  ?COMPREHENSIVE METABOLIC PANEL  ?URINALYSIS, ROUTINE W REFLEX MICROSCOPIC  ?TROPONIN I (HIGH SENSITIVITY)  ? ? ?EKG ?None ? ?Radiology ?No results found. ? ?Procedures ?Marland Kitchen.Critical Care ?Performed by: Franne FortsGray, Nicolle Heward P, DO ?Authorized by: Franne FortsGray, Rashanna Christiana P, DO  ? ?Critical care provider statement:  ?  Critical care time (minutes):  76 ?  Critical care was necessary to treat or prevent imminent or life-threatening deterioration of the following conditions: bowel obstruciton with hernia to OR. ?  Critical care was time spent personally by me on the following activities:  Development of treatment plan with patient or surrogate, discussions with consultants, evaluation of patient's response to treatment, examination of patient, ordering and review of laboratory studies, ordering and review of radiographic studies, ordering and performing treatments and interventions, pulse oximetry, re-evaluation of patient's condition and review of old charts ?  Care discussed with comment:  General surgery  ? ? ?Medications Ordered in ED ?Medications  ?ondansetron (ZOFRAN) injection 4 mg (has no administration in time range)  ?fentaNYL (SUBLIMAZE) injection 50 mcg (has no administration in time range)  ?ondansetron (ZOFRAN) injection 4 mg (has no administration in time range)  ?sodium chloride 0.9 % bolus 1,000 mL (has no administration in time range)  ? ? ?ED Course/ Medical Decision Making/ A&P ?  ?                        ?Medical Decision Making ?Amount and/or Complexity of Data Reviewed ?Labs: ordered. ?Radiology: ordered. ? ?Risk ?Prescription drug management. ? ? ?228:7002 PM ? 66 year old female with past medical history as listed below presenting for complaints of abdominal pain.   Patient is alert and oriented x3, no acute distress, afebrile, stable vital signs.  Physical exam demonstrates generalized abdominal tenderness.  No guarding, rigidity, or distention.  Given IV fluids, Zofran, fentanyl for symptomatic management. ? ?CT abdomen and pelvis with IV contrast demonstrates: ?1. Small bowel obstruction secondary to the presence of a 4.0 cm x 3.8 cm right inguinal hernia. ?2. Moderate-sized hiatal hernia. ? ?General surgeon Dr. Freida BusmanAllen who recommends OR tonight. Pt is NPO. Requests ED to ED transfer due to no beds. Patient accepted by ED doc, Dr. Particia NearingHaviland. ? ? ? ? ? ? ? ?Final Clinical Impression(s) / ED Diagnoses ?Final diagnoses:  ?Small bowel obstruction (HCC)  ?Unilateral inguinal hernia without obstruction or gangrene, recurrence not specified  ? ? ?Rx / DC Orders ?ED Discharge Orders   ? ?  None  ? ?  ? ? ?  ?Franne Forts, DO ?11/06/21 2217 ? ?

## 2021-11-06 NOTE — ED Triage Notes (Signed)
She c/o sudden onset of mid abd. Pain about an hour ago, which persists. Pain 10/10. She is writhing, as if in much pain. She theorizes that she may have a small bowel obstruction. She tells me she has hx of chronic constipation and has never underwent any operation(s). ?

## 2021-11-06 NOTE — ED Notes (Signed)
Report given to [facility]. 

## 2021-11-07 ENCOUNTER — Other Ambulatory Visit: Payer: Self-pay

## 2021-11-07 ENCOUNTER — Encounter (HOSPITAL_COMMUNITY): Payer: Self-pay | Admitting: Anesthesiology

## 2021-11-07 ENCOUNTER — Emergency Department (HOSPITAL_COMMUNITY): Payer: BC Managed Care – PPO | Admitting: Anesthesiology

## 2021-11-07 ENCOUNTER — Ambulatory Visit: Payer: BC Managed Care – PPO | Admitting: Family Medicine

## 2021-11-07 ENCOUNTER — Encounter (HOSPITAL_COMMUNITY)
Admission: EM | Disposition: A | Discharge status: Home / Self Care | Payer: Self-pay | Source: Home / Self Care | Attending: Emergency Medicine

## 2021-11-07 DIAGNOSIS — K4090 Unilateral inguinal hernia, without obstruction or gangrene, not specified as recurrent: Secondary | ICD-10-CM | POA: Diagnosis not present

## 2021-11-07 DIAGNOSIS — K4030 Unilateral inguinal hernia, with obstruction, without gangrene, not specified as recurrent: Secondary | ICD-10-CM | POA: Diagnosis not present

## 2021-11-07 DIAGNOSIS — Z79899 Other long term (current) drug therapy: Secondary | ICD-10-CM | POA: Diagnosis not present

## 2021-11-07 DIAGNOSIS — K4130 Unilateral femoral hernia, with obstruction, without gangrene, not specified as recurrent: Secondary | ICD-10-CM | POA: Diagnosis not present

## 2021-11-07 DIAGNOSIS — R1030 Lower abdominal pain, unspecified: Secondary | ICD-10-CM | POA: Diagnosis not present

## 2021-11-07 DIAGNOSIS — Z8719 Personal history of other diseases of the digestive system: Secondary | ICD-10-CM

## 2021-11-07 HISTORY — DX: Personal history of other diseases of the digestive system: Z87.19

## 2021-11-07 HISTORY — PX: INGUINAL HERNIA REPAIR: SHX194

## 2021-11-07 HISTORY — PX: LAPAROSCOPY: SHX197

## 2021-11-07 SURGERY — LAPAROSCOPY, DIAGNOSTIC
Anesthesia: General

## 2021-11-07 MED ORDER — SUCCINYLCHOLINE CHLORIDE 200 MG/10ML IV SOSY
PREFILLED_SYRINGE | INTRAVENOUS | Status: DC | PRN
  Administered 2021-11-07: 120 mg via INTRAVENOUS

## 2021-11-07 MED ORDER — LACTATED RINGERS IV SOLN: INTRAVENOUS | Status: DC | PRN

## 2021-11-07 MED ORDER — ROCURONIUM BROMIDE 10 MG/ML (PF) SYRINGE
PREFILLED_SYRINGE | INTRAVENOUS | Status: AC
  Filled 2021-11-07: qty 10

## 2021-11-07 MED ORDER — HYDROMORPHONE HCL 1 MG/ML IJ SOLN
0.2500 mg | INTRAMUSCULAR | Status: DC | PRN
  Administered 2021-11-07: 0.5 mg via INTRAVENOUS

## 2021-11-07 MED ORDER — LACTATED RINGERS IV SOLN: INTRAVENOUS | Status: DC

## 2021-11-07 MED ORDER — ACETAMINOPHEN 325 MG PO TABS: 325.0000 mg | ORAL_TABLET | Freq: Once | ORAL | Status: DC | PRN

## 2021-11-07 MED ORDER — PROPOFOL 10 MG/ML IV BOLUS
INTRAVENOUS | Status: AC
  Filled 2021-11-07: qty 20

## 2021-11-07 MED ORDER — CEFAZOLIN SODIUM-DEXTROSE 2-3 GM-%(50ML) IV SOLR
INTRAVENOUS | Status: DC | PRN
  Administered 2021-11-07: 2 g via INTRAVENOUS

## 2021-11-07 MED ORDER — LIDOCAINE 2% (20 MG/ML) 5 ML SYRINGE
INTRAMUSCULAR | Status: DC | PRN
  Administered 2021-11-07: 40 mg via INTRAVENOUS

## 2021-11-07 MED ORDER — ONDANSETRON HCL 4 MG/2ML IJ SOLN
4.0000 mg | Freq: Four times a day (QID) | INTRAMUSCULAR | Status: DC | PRN
  Administered 2021-11-07: 4 mg via INTRAVENOUS
  Filled 2021-11-07: qty 2

## 2021-11-07 MED ORDER — DEXAMETHASONE SODIUM PHOSPHATE 10 MG/ML IJ SOLN
INTRAMUSCULAR | Status: AC
  Filled 2021-11-07: qty 1

## 2021-11-07 MED ORDER — METHOCARBAMOL 500 MG PO TABS
500.0000 mg | ORAL_TABLET | Freq: Four times a day (QID) | ORAL | Status: DC
  Administered 2021-11-07 (×3): 500 mg via ORAL
  Filled 2021-11-07 (×4): qty 1

## 2021-11-07 MED ORDER — MELATONIN 3 MG PO TABS
3.0000 mg | ORAL_TABLET | Freq: Every day | ORAL | Status: DC
  Administered 2021-11-07: 3 mg via ORAL
  Filled 2021-11-07: qty 1

## 2021-11-07 MED ORDER — CEFAZOLIN SODIUM 1 G IJ SOLR
INTRAMUSCULAR | Status: AC
  Filled 2021-11-07: qty 20

## 2021-11-07 MED ORDER — CEFAZOLIN SODIUM-DEXTROSE 2-4 GM/100ML-% IV SOLN: 2.0000 g | INTRAVENOUS | Status: DC

## 2021-11-07 MED ORDER — EPHEDRINE 5 MG/ML INJ
INTRAVENOUS | Status: AC
  Filled 2021-11-07: qty 5

## 2021-11-07 MED ORDER — HYDROMORPHONE HCL 1 MG/ML IJ SOLN
0.5000 mg | INTRAMUSCULAR | Status: DC | PRN
  Administered 2021-11-07: 0.5 mg via INTRAVENOUS
  Filled 2021-11-07: qty 0.5

## 2021-11-07 MED ORDER — MEPERIDINE HCL 25 MG/ML IJ SOLN: 6.2500 mg | INTRAMUSCULAR | Status: DC | PRN

## 2021-11-07 MED ORDER — VENLAFAXINE HCL 37.5 MG PO TABS
37.5000 mg | ORAL_TABLET | Freq: Every day | ORAL | Status: DC
Start: 2021-11-07 — End: 2021-11-07

## 2021-11-07 MED ORDER — PROPOFOL 10 MG/ML IV BOLUS
INTRAVENOUS | Status: DC | PRN
  Administered 2021-11-07: 110 mg via INTRAVENOUS

## 2021-11-07 MED ORDER — SUGAMMADEX SODIUM 200 MG/2ML IV SOLN
INTRAVENOUS | Status: DC | PRN
  Administered 2021-11-07 (×2): 100 mg via INTRAVENOUS

## 2021-11-07 MED ORDER — ONDANSETRON HCL 4 MG/2ML IJ SOLN
INTRAMUSCULAR | Status: AC
  Filled 2021-11-07: qty 2

## 2021-11-07 MED ORDER — BUPIVACAINE-EPINEPHRINE 0.25% -1:200000 IJ SOLN
INTRAMUSCULAR | Status: DC | PRN
  Administered 2021-11-07: 25 mL

## 2021-11-07 MED ORDER — ENOXAPARIN SODIUM 40 MG/0.4ML IJ SOSY
40.0000 mg | PREFILLED_SYRINGE | INTRAMUSCULAR | Status: DC
  Administered 2021-11-07: 40 mg via SUBCUTANEOUS
  Filled 2021-11-07: qty 0.4

## 2021-11-07 MED ORDER — EPHEDRINE SULFATE-NACL 50-0.9 MG/10ML-% IV SOSY
PREFILLED_SYRINGE | INTRAVENOUS | Status: DC | PRN
Start: 2021-11-07 — End: 2021-11-07
  Administered 2021-11-07 (×3): 5 mg via INTRAVENOUS

## 2021-11-07 MED ORDER — FENTANYL CITRATE (PF) 250 MCG/5ML IJ SOLN
INTRAMUSCULAR | Status: AC
  Filled 2021-11-07: qty 5

## 2021-11-07 MED ORDER — 0.9 % SODIUM CHLORIDE (POUR BTL) OPTIME
TOPICAL | Status: DC | PRN
  Administered 2021-11-07: 1000 mL

## 2021-11-07 MED ORDER — ACETAMINOPHEN 10 MG/ML IV SOLN
INTRAVENOUS | Status: AC
  Filled 2021-11-07: qty 100

## 2021-11-07 MED ORDER — DIPHENHYDRAMINE HCL 12.5 MG/5ML PO ELIX: 12.5000 mg | ORAL_SOLUTION | Freq: Four times a day (QID) | ORAL | Status: DC | PRN

## 2021-11-07 MED ORDER — BUPIVACAINE-EPINEPHRINE (PF) 0.25% -1:200000 IJ SOLN
INTRAMUSCULAR | Status: AC
  Filled 2021-11-07: qty 30

## 2021-11-07 MED ORDER — ROCURONIUM BROMIDE 10 MG/ML (PF) SYRINGE
PREFILLED_SYRINGE | INTRAVENOUS | Status: DC | PRN
  Administered 2021-11-07: 20 mg via INTRAVENOUS
  Administered 2021-11-07: 10 mg via INTRAVENOUS
  Administered 2021-11-07: 40 mg via INTRAVENOUS

## 2021-11-07 MED ORDER — ACETAMINOPHEN 500 MG PO TABS
1000.0000 mg | ORAL_TABLET | Freq: Four times a day (QID) | ORAL | Status: DC
  Administered 2021-11-07 – 2021-11-08 (×3): 1000 mg via ORAL
  Filled 2021-11-07 (×3): qty 2

## 2021-11-07 MED ORDER — SENNOSIDES-DOCUSATE SODIUM 8.6-50 MG PO TABS
2.0000 | ORAL_TABLET | Freq: Two times a day (BID) | ORAL | Status: DC
  Administered 2021-11-07 – 2021-11-08 (×2): 2 via ORAL
  Filled 2021-11-07 (×2): qty 2

## 2021-11-07 MED ORDER — DIPHENHYDRAMINE HCL 50 MG/ML IJ SOLN: 12.5000 mg | Freq: Four times a day (QID) | INTRAMUSCULAR | Status: DC | PRN

## 2021-11-07 MED ORDER — SUCCINYLCHOLINE CHLORIDE 200 MG/10ML IV SOSY
PREFILLED_SYRINGE | INTRAVENOUS | Status: AC
  Filled 2021-11-07: qty 10

## 2021-11-07 MED ORDER — POLYETHYLENE GLYCOL 3350 17 G PO PACK
17.0000 g | PACK | Freq: Every day | ORAL | Status: DC
  Administered 2021-11-07 – 2021-11-08 (×2): 17 g via ORAL
  Filled 2021-11-07 (×2): qty 1

## 2021-11-07 MED ORDER — HYDROMORPHONE HCL 1 MG/ML IJ SOLN
INTRAMUSCULAR | Status: AC
  Filled 2021-11-07: qty 1

## 2021-11-07 MED ORDER — VENLAFAXINE HCL ER 37.5 MG PO CP24
37.5000 mg | ORAL_CAPSULE | Freq: Every day | ORAL | Status: DC
Start: 2021-11-07 — End: 2021-11-08
  Administered 2021-11-07 – 2021-11-08 (×2): 37.5 mg via ORAL
  Filled 2021-11-07 (×2): qty 1

## 2021-11-07 MED ORDER — FENTANYL CITRATE (PF) 250 MCG/5ML IJ SOLN
INTRAMUSCULAR | Status: DC | PRN
  Administered 2021-11-07 (×3): 50 ug via INTRAVENOUS

## 2021-11-07 MED ORDER — DEXAMETHASONE SODIUM PHOSPHATE 10 MG/ML IJ SOLN
INTRAMUSCULAR | Status: DC | PRN
  Administered 2021-11-07: 5 mg via INTRAVENOUS

## 2021-11-07 MED ORDER — ACETAMINOPHEN 10 MG/ML IV SOLN
1000.0000 mg | Freq: Once | INTRAVENOUS | Status: DC | PRN
  Administered 2021-11-07: 1000 mg via INTRAVENOUS

## 2021-11-07 MED ORDER — LIDOCAINE 2% (20 MG/ML) 5 ML SYRINGE
INTRAMUSCULAR | Status: AC
  Filled 2021-11-07: qty 5

## 2021-11-07 MED ORDER — DOCUSATE SODIUM 100 MG PO CAPS
100.0000 mg | ORAL_CAPSULE | Freq: Two times a day (BID) | ORAL | Status: DC
  Administered 2021-11-07 – 2021-11-08 (×3): 100 mg via ORAL
  Filled 2021-11-07 (×3): qty 1

## 2021-11-07 MED ORDER — ONDANSETRON 4 MG PO TBDP: 4.0000 mg | ORAL_TABLET | Freq: Four times a day (QID) | ORAL | Status: DC | PRN

## 2021-11-07 MED ORDER — ONDANSETRON HCL 4 MG/2ML IJ SOLN
INTRAMUSCULAR | Status: DC | PRN
Start: 2021-11-07 — End: 2021-11-07
  Administered 2021-11-07: 4 mg via INTRAVENOUS

## 2021-11-07 MED ORDER — AMISULPRIDE (ANTIEMETIC) 5 MG/2ML IV SOLN: 10.0000 mg | Freq: Once | INTRAVENOUS | Status: DC | PRN

## 2021-11-07 MED ORDER — ACETAMINOPHEN 160 MG/5ML PO SOLN: 325.0000 mg | Freq: Once | ORAL | Status: DC | PRN

## 2021-11-07 SURGICAL SUPPLY — 49 items
ADH SKN CLS APL DERMABOND .7 (GAUZE/BANDAGES/DRESSINGS) ×1
APL PRP STRL LF DISP 70% ISPRP (MISCELLANEOUS) ×1
APPLIER CLIP 5 13 M/L LIGAMAX5 (MISCELLANEOUS)
APR CLP MED LRG 5 ANG JAW (MISCELLANEOUS)
BAG COUNTER SPONGE SURGICOUNT (BAG) ×3 IMPLANT
BAG SPNG CNTER NS LX DISP (BAG) ×1
BLADE CLIPPER SURG (BLADE) ×1 IMPLANT
CANISTER SUCT 3000ML PPV (MISCELLANEOUS) ×1 IMPLANT
CHLORAPREP W/TINT 26 (MISCELLANEOUS) ×3 IMPLANT
CLIP APPLIE 5 13 M/L LIGAMAX5 (MISCELLANEOUS) IMPLANT
COVER SURGICAL LIGHT HANDLE (MISCELLANEOUS) ×3 IMPLANT
DERMABOND ADVANCED (GAUZE/BANDAGES/DRESSINGS) ×1
DERMABOND ADVANCED .7 DNX12 (GAUZE/BANDAGES/DRESSINGS) ×2 IMPLANT
DEVICE SECURE STRAP 25 ABSORB (INSTRUMENTS) ×2 IMPLANT
DISSECTOR BLUNT TIP ENDO 5MM (MISCELLANEOUS) IMPLANT
ELECT REM PT RETURN 9FT ADLT (ELECTROSURGICAL) ×2
ELECTRODE REM PT RTRN 9FT ADLT (ELECTROSURGICAL) ×2 IMPLANT
GLOVE SURG POLY MICRO LF SZ5.5 (GLOVE) ×5 IMPLANT
GLOVE SURG UNDER POLY LF SZ6 (GLOVE) ×3 IMPLANT
GOWN STRL REUS W/ TWL LRG LVL3 (GOWN DISPOSABLE) ×6 IMPLANT
GOWN STRL REUS W/TWL LRG LVL3 (GOWN DISPOSABLE) ×4
KIT BASIN OR (CUSTOM PROCEDURE TRAY) ×3 IMPLANT
KIT TURNOVER KIT B (KITS) ×3 IMPLANT
MESH ULTRAPRO 3X6 7.6X15CM (Mesh General) ×1 IMPLANT
NDL INSUFFLATION 14GA 120MM (NEEDLE) ×2 IMPLANT
NEEDLE INSUFFLATION 14GA 120MM (NEEDLE) ×2 IMPLANT
NS IRRIG 1000ML POUR BTL (IV SOLUTION) ×3 IMPLANT
PAD ARMBOARD 7.5X6 YLW CONV (MISCELLANEOUS) ×6 IMPLANT
PENCIL SMOKE EVACUATOR (MISCELLANEOUS) ×1 IMPLANT
SCISSORS LAP 5X35 DISP (ENDOMECHANICALS) IMPLANT
SET IRRIG TUBING LAPAROSCOPIC (IRRIGATION / IRRIGATOR) IMPLANT
SET TUBE SMOKE EVAC HIGH FLOW (TUBING) ×3 IMPLANT
SLEEVE ENDOPATH XCEL 5M (ENDOMECHANICALS) ×3 IMPLANT
SUT MNCRL AB 4-0 PS2 18 (SUTURE) ×3 IMPLANT
SUT NOVA NAB GS-21 0 18 T12 DT (SUTURE) ×1 IMPLANT
SUT VIC AB 2-0 SH 18 (SUTURE) ×1 IMPLANT
SUT VIC AB 3-0 SH 8-18 (SUTURE) ×1 IMPLANT
SUT VICRYL 0 UR6 27IN ABS (SUTURE) IMPLANT
TOWEL GREEN STERILE (TOWEL DISPOSABLE) ×3 IMPLANT
TOWEL GREEN STERILE FF (TOWEL DISPOSABLE) ×3 IMPLANT
TRAY LAPAROSCOPIC MC (CUSTOM PROCEDURE TRAY) ×3 IMPLANT
TROCAR XCEL 12X100 BLDLESS (ENDOMECHANICALS) ×3 IMPLANT
TROCAR XCEL BLADELESS 5X75MML (TROCAR) ×6 IMPLANT
TROCAR XCEL BLUNT TIP 100MML (ENDOMECHANICALS) IMPLANT
TROCAR XCEL NON-BLD 5MMX100MML (ENDOMECHANICALS) ×3 IMPLANT
TUBE CONNECTING 12X1/4 (SUCTIONS) ×1 IMPLANT
WARMER LAPAROSCOPE (MISCELLANEOUS) ×3 IMPLANT
WATER STERILE IRR 1000ML POUR (IV SOLUTION) ×2 IMPLANT
YANKAUER SUCT BULB TIP NO VENT (SUCTIONS) ×1 IMPLANT

## 2021-11-07 NOTE — Anesthesia Preprocedure Evaluation (Signed)
Anesthesia Evaluation  ?Patient identified by MRN, date of birth, ID band ?Patient awake ? ? ? ?Reviewed: ?Allergy & Precautions, NPO status , Patient's Chart, lab work & pertinent test results ? ?History of Anesthesia Complications ?(+) PONV and history of anesthetic complications ? ?Airway ?Mallampati: I ? ?TM Distance: >3 FB ?Neck ROM: Full ? ? ? Dental ?no notable dental hx. ?(+) Dental Advisory Given ?  ?Pulmonary ?neg pulmonary ROS,  ?  ?Pulmonary exam normal ? ? ? ? ? ? ? Cardiovascular ?negative cardio ROS ?Normal cardiovascular exam ? ? ?  ?Neuro/Psych ?Anxiety   ? GI/Hepatic ?negative GI ROS, Neg liver ROS,   ?Endo/Other  ?negative endocrine ROS ? Renal/GU ?negative Renal ROS  ? ?  ?Musculoskeletal ? ?(+) Arthritis ,  ? Abdominal ?  ?Peds ? Hematology ?negative hematology ROS ?(+)   ?Anesthesia Other Findings ? ? Reproductive/Obstetrics ? ?  ? ? ? ? ? ? ? ? ? ? ? ? ? ?  ?  ? ? ? ? ? ? ? ? ?Anesthesia Physical ?Anesthesia Plan ? ?ASA: 2 and emergent ? ?Anesthesia Plan: General  ? ?Post-op Pain Management:   ? ?Induction: Intravenous ? ?PONV Risk Score and Plan: 4 or greater and Ondansetron, Dexamethasone, Midazolam and Treatment may vary due to age or medical condition ? ?Airway Management Planned: Oral ETT ? ?Additional Equipment: None ? ?Intra-op Plan:  ? ?Post-operative Plan: Extubation in OR ? ?Informed Consent: I have reviewed the patients History and Physical, chart, labs and discussed the procedure including the risks, benefits and alternatives for the proposed anesthesia with the patient or authorized representative who has indicated his/her understanding and acceptance.  ? ? ? ?Dental advisory given ? ?Plan Discussed with: CRNA ? ?Anesthesia Plan Comments:   ? ? ? ? ? ? ?Anesthesia Quick Evaluation ? ?

## 2021-11-07 NOTE — H&P (Signed)
? ?Wiliam KeMaura A Rembert ?04/14/1956  ?[id number].   ? ?Chief Complaint/Reason for Consult: incarcerated hernia with SBO ? ?HPI:  ?Ms. Kaitlyn Dominguez is a 66 year old female who presented to the ED with abdominal pain and nausea.  She reports a long history of chronic constipation, which has been especially severe in the last 2 weeks.  Around 5:00 yesterday afternoon, she started having severe suprapubic abdominal pain.  She has continued to pass flatus.  Her last movement was yesterday and she reports it was very firm.  She has not had fevers or chills.  Labs in the ED were unremarkable.  CT scan showed a right inguinal hernia with proximal small bowel dilation.  The hernia was not able to be reduced in the ED, general surgery was consulted. ? ?The patient is otherwise in good health and has not had any prior abdominal surgeries. ? ?ROS: ?Review of Systems  ?Constitutional:  Negative for chills and fever.  ?HENT:  Negative for sore throat.   ?Respiratory:  Negative for shortness of breath, wheezing and stridor.   ?Cardiovascular:  Negative for chest pain.  ?Gastrointestinal:  Positive for abdominal pain and nausea. Negative for vomiting.  ?Neurological:  Negative for seizures and loss of consciousness.  ? ?Family History  ?Problem Relation Age of Onset  ? Heart attack Mother   ? Heart disease Maternal Grandmother   ? Alzheimer's disease Father   ? Colon cancer Neg Hx   ? Esophageal cancer Neg Hx   ? Rectal cancer Neg Hx   ? Stomach cancer Neg Hx   ? ? ?Past Medical History:  ?Diagnosis Date  ? Anxiety   ? Osteoarthritis   ? PONV (postoperative nausea and vomiting)   ? had emesis after surgery when went home  ? Serum calcium elevated   ? Vaginal atrophy   ? ? ?Past Surgical History:  ?Procedure Laterality Date  ? COLONOSCOPY    ? FOOT SURGERY    ? left; on multiple occasions  ? KNEE ARTHROSCOPY  2001, 2005  ? left x 2  ? SHOULDER ARTHROSCOPY WITH ROTATOR CUFF REPAIR AND SUBACROMIAL DECOMPRESSION Right 12/01/2019  ? Procedure:  SHOULDER ARTHROSCOPY WITH ROTATOR CUFF REPAIR, DISTAL CLAVICLE EXCISION;  Surgeon: Jones Broomhandler, Justin, MD;  Location: [facility];  Service: Orthopedics;  Laterality: Right;  ? SUBACROMIAL DECOMPRESSION Right 12/01/2019  ? Procedure: SUBACROMIAL DECOMPRESSION;  Surgeon: Jones Broomhandler, Justin, MD;  Location: [facility];  Service: Orthopedics;  Laterality: Right;  ? ? ?Social History:  reports that she has never smoked. She has never used smokeless tobacco. She reports current alcohol use. She reports that she does not use drugs. ? ?Allergies:  ?Allergies  ?Allergen Reactions  ? Penicillins   ?  REACTION: hives  ? ? ?(Not in a hospital admission) ? ? ? ?Physical Exam: ?Blood pressure 119/73, pulse 63, temperature 98.3 ?F (36.8 ?C), temperature source Oral, resp. rate 17, SpO2 97 %. ?General: resting comfortably, appears stated age, no apparent distress ?Neurological: alert and oriented, no focal deficits, cranial nerves grossly in tact ?HEENT: normocephalic, atraumatic, no scleral icterus ?CV: extremities warm and well-perfused ?Respiratory: normal work of breathing on room air, symmetric chest wall expansion ?Abdomen: soft, nondistended, mildly tender to palpation lower abdomen.  There is a mass palpable along the right inguinal ligament, which is not reducible but not tender on exam.  ?Extremities: warm and well-perfused, no deformities, moving all extremities spontaneously ?Psychiatric: normal mood and affect ?Skin: warm and dry, no jaundice, no rashes or lesions ? ? ?  Results for orders placed or performed during the hospital encounter of 11/06/21 (from the past 48 hour(s))  ?Lipase, blood     Status: None  ? Collection Time: 11/06/21  7:17 PM  ?Result Value Ref Range  ? Lipase 16 11 - 51 U/L  ?  Comment: Performed at [facility], [address] [place], [place] [address]  ?Comprehensive metabolic panel     Status: Abnormal  ? Collection Time: 11/06/21  7:17 PM  ?Result  Value Ref Range  ? Sodium 134 (L) 135 - 145 mmol/L  ? Potassium 3.8 3.5 - 5.1 mmol/L  ? Chloride 101 98 - 111 mmol/L  ? CO2 22 22 - 32 mmol/L  ? Glucose, Bld 96 70 - 99 mg/dL  ?  Comment: Glucose reference range applies only to samples taken after fasting for at least 8 hours.  ? BUN 13 8 - 23 mg/dL  ? Creatinine, Ser 0.69 0.44 - 1.00 mg/dL  ? Calcium 10.9 (H) 8.9 - 10.3 mg/dL  ? Total Protein 7.3 6.5 - 8.1 g/dL  ? Albumin 5.0 3.5 - 5.0 g/dL  ? AST 20 15 - 41 U/L  ? ALT 12 0 - 44 U/L  ? Alkaline Phosphatase 59 38 - 126 U/L  ? Total Bilirubin 0.6 0.3 - 1.2 mg/dL  ? GFR, Estimated >60 >60 mL/min  ?  Comment: (NOTE) ?Calculated using the CKD-EPI Creatinine Equation (2021) ?  ? Anion gap 11 5 - 15  ?  Comment: Performed at [facility], [address] [place], [place] [address]  ?CBC     Status: None  ? Collection Time: 11/06/21  7:17 PM  ?Result Value Ref Range  ? WBC 7.2 4.0 - 10.5 K/uL  ? RBC 4.80 3.87 - 5.11 MIL/uL  ? Hemoglobin 13.4 12.0 - 15.0 g/dL  ? HCT 40.9 36.0 - 46.0 %  ? MCV 85.2 80.0 - 100.0 fL  ? MCH 27.9 26.0 - 34.0 pg  ? MCHC 32.8 30.0 - 36.0 g/dL  ? RDW 12.4 11.5 - 15.5 %  ? Platelets 289 150 - 400 K/uL  ? nRBC 0.0 0.0 - 0.2 %  ?  Comment: Performed at [facility], [address], [place], [place] [address]  ?Troponin I (High Sensitivity)     Status: None  ? Collection Time: 11/06/21  7:41 PM  ?Result Value Ref Range  ? Troponin I (High Sensitivity) <2 <18 ng/L  ?  Comment: (NOTE) ?Elevated high sensitivity troponin I (hsTnI) values and significant  ?changes across serial measurements may suggest ACS but many other  ?chronic and acute conditions are known to elevate hsTnI results.  ?Refer to the "Links" section for chest pain algorithms and additional  ?guidance. ?Performed at [facility], [address], ?[place], [place] [address] ?  ?Urinalysis, Routine w reflex microscopic Urine, Clean Catch     Status: Abnormal  ? Collection Time:  11/06/21  7:55 PM  ?Result Value Ref Range  ? Color, Urine YELLOW YELLOW  ? APPearance CLEAR CLEAR  ? Specific Gravity, Urine 1.017 1.005 - 1.030  ? pH 6.5 5.0 - 8.0  ? Glucose, UA NEGATIVE NEGATIVE mg/dL  ? Hgb urine dipstick SMALL (A) NEGATIVE  ? Bilirubin Urine NEGATIVE NEGATIVE  ? Ketones, ur 40 (A) NEGATIVE mg/dL  ? Protein, ur NEGATIVE NEGATIVE mg/dL  ? Nitrite NEGATIVE NEGATIVE  ? Leukocytes,Ua NEGATIVE NEGATIVE  ? RBC / HPF 11-20 0 - 5 RBC/hpf  ? WBC, UA 0-5 0 -  5 WBC/hpf  ? Bacteria, UA RARE (A) NONE SEEN  ? Squamous Epithelial / LPF 0-5 0 - 5  ? Mucus PRESENT   ? Budding Yeast PRESENT   ?  Comment: Performed at [facility], [address], [place], [place] [address]  ? ?CT ABDOMEN PELVIS W CONTRAST ? ?Result Date: 11/06/2021 ?CLINICAL DATA:  Chronic constipation with lower abdominal pain. EXAM: CT ABDOMEN AND PELVIS WITH CONTRAST TECHNIQUE: Multidetector CT imaging of the abdomen and pelvis was performed using the standard protocol following bolus administration of intravenous contrast. RADIATION DOSE REDUCTION: This exam was performed according to the departmental dose-optimization program which includes automated exposure control, adjustment of the mA and/or kV according to patient size and/or use of iterative reconstruction technique. CONTRAST:  OMNIPAQUE IOHEXOL 300 MG/ML  SOLN COMPARISON:  None. FINDINGS: Lower chest: No acute abnormality. Hepatobiliary: No focal liver abnormality is seen. No gallstones, gallbladder wall thickening, or biliary dilatation. Pancreas: Unremarkable. No pancreatic ductal dilatation or surrounding inflammatory changes. Spleen: Normal in size without focal abnormality. Adrenals/Urinary Tract: Adrenal glands are unremarkable. Kidneys are normal, without renal calculi, focal lesion, or hydronephrosis. Bladder is unremarkable. Stomach/Bowel: There is a moderate-sized hiatal hernia. Appendix appears normal. A dilated segment of distal small bowel is  seen within a 4.0 cm x 3.8 cm right inguinal hernia (axial CT images 67 through 77, CT series 2). A mildly prominent small bowel loop is seen entering the right inguinal hernia (approximately 2.5 cm in diameter), whi

## 2021-11-07 NOTE — Progress Notes (Signed)
Responded to consult for IV. Pt states desire to hold on new IV placement. States current IV medications are prn and does not want to have a new IV unless she needs medication at a later time. ?

## 2021-11-07 NOTE — Anesthesia Procedure Notes (Signed)
Procedure Name: Intubation ?Date/Time: 11/07/2021 4:08 AM ?Performed by: Trinna Post., CRNA ?Pre-anesthesia Checklist: Patient identified, Emergency Drugs available, Suction available, Patient being monitored and Timeout performed ?Patient Re-evaluated:Patient Re-evaluated prior to induction ?Oxygen Delivery Method: Circle system utilized ?Preoxygenation: Pre-oxygenation with 100% oxygen ?Induction Type: IV induction, Rapid sequence and Cricoid Pressure applied ?Laryngoscope Size: Mac and 3 ?Grade View: Grade I ?Tube type: Oral ?Tube size: 7.0 mm ?Number of attempts: 1 ?Airway Equipment and Method: Stylet ?Placement Confirmation: ETT inserted through vocal cords under direct vision, positive ETCO2 and breath sounds checked- equal and bilateral ?Secured at: 21 cm ?Tube secured with: Tape ?Dental Injury: Teeth and Oropharynx as per pre-operative assessment  ? ? ? ? ?

## 2021-11-07 NOTE — ED Notes (Signed)
RN discussed plan of care with pt and pt's aunt at bedside. This RN empathized with pt about wait times and ensured that all of pt's questions and family's questions were answered, including admission process.  ?

## 2021-11-07 NOTE — ED Notes (Signed)
Dr Allen at pt bedside 

## 2021-11-07 NOTE — Progress Notes (Signed)
Mobility Specialist Progress Note: ? ? 11/07/21 1208  ?Mobility  ?Activity Ambulated with assistance in hallway  ?Level of Assistance Standby assist, set-up cues, supervision of patient - no hands on  ?Assistive Device None  ?Distance Ambulated (ft) 570 ft  ?Activity Response Tolerated well  ?$Mobility charge 1 Mobility  ? ?Pt received in bed willing to participate in mobility. Complaints of incision pain. Needed minA to get to EOB but stand-by throughout ambulation. Left in bed with call bell in reach and all needs met. ? ?Makhari Dovidio ?Mobility Specialist ?Primary Phone [phone] ? ?

## 2021-11-07 NOTE — Transfer of Care (Signed)
Immediate Anesthesia Transfer of Care Note ? ?Patient: Kaitlyn Dominguez ? ?Procedure(s) Performed: LAPAROSCOPY DIAGNOSTIC ?HERNIA REPAIR INGUINAL INCARCERATED ? ?Patient Location: PACU ? ?Anesthesia Type:General ? ?Level of Consciousness: awake, alert  and oriented ? ?Airway & Oxygen Therapy: Patient Spontanous Breathing ? ?Post-op Assessment: Report given to RN and Post -op Vital signs reviewed and stable ? ?Post vital signs: Reviewed and stable ? ?Last Vitals:  ?Vitals Value Taken Time  ?BP 109/56 11/07/21 0607  ?Temp    ?Pulse 84 11/07/21 0609  ?Resp 22 11/07/21 0609  ?SpO2 99 % 11/07/21 0609  ?Vitals shown include unvalidated device data. ? ?Last Pain:  ?Vitals:  ? 11/06/21 2139  ?TempSrc:   ?PainSc: 4   ?   ? ?  ? ?Complications: No notable events documented. ?

## 2021-11-07 NOTE — Anesthesia Postprocedure Evaluation (Signed)
Anesthesia Post Note ? ?Patient: Kaitlyn Dominguez ? ?Procedure(s) Performed: LAPAROSCOPY DIAGNOSTIC ?HERNIA REPAIR INGUINAL INCARCERATED ? ?  ? ?Patient location during evaluation: PACU ?Anesthesia Type: General ?Level of consciousness: awake and alert ?Pain management: pain level controlled ?Vital Signs Assessment: post-procedure vital signs reviewed and stable ?Respiratory status: spontaneous breathing, nonlabored ventilation, respiratory function stable and patient connected to nasal cannula oxygen ?Cardiovascular status: blood pressure returned to baseline and stable ?Postop Assessment: no apparent nausea or vomiting ?Anesthetic complications: no ? ? ?No notable events documented. ? ?Last Vitals:  ?Vitals:  ? 11/07/21 0655 11/07/21 0710  ?BP: 99/60 (!) 96/59  ?Pulse: 75 73  ?Resp: 14 18  ?Temp:    ?SpO2: 100% 98%  ?  ?Last Pain:  ?Vitals:  ? 11/07/21 0710  ?TempSrc:   ?PainSc: 0-No pain  ? ? ?  ?  ?  ?  ?  ?  ? ?Shelton Silvas ? ? ? ? ?

## 2021-11-07 NOTE — ED Provider Notes (Signed)
Assumed care.  Transferred from [facility].  Found to have sbo.  Sent for surgery by general surgery. ?  Melene Plan, DO ?11/07/21 0045 ? ?

## 2021-11-07 NOTE — Progress Notes (Signed)
Patient ID: Kaitlyn Dominguez, female   DOB: Dec 13, 1955, 66 y.o.   MRN: [id number] ?She had some questions and I discussed the plan with her. ? ?[name], MD, MPH, FACS ?Please use [email or web address] to contact on call provider ? ?

## 2021-11-08 ENCOUNTER — Encounter (HOSPITAL_COMMUNITY): Payer: Self-pay | Admitting: Surgery

## 2021-11-08 DIAGNOSIS — R1030 Lower abdominal pain, unspecified: Secondary | ICD-10-CM | POA: Diagnosis not present

## 2021-11-08 DIAGNOSIS — Z79899 Other long term (current) drug therapy: Secondary | ICD-10-CM | POA: Diagnosis not present

## 2021-11-08 DIAGNOSIS — K4090 Unilateral inguinal hernia, without obstruction or gangrene, not specified as recurrent: Secondary | ICD-10-CM | POA: Diagnosis not present

## 2021-11-08 DIAGNOSIS — K4130 Unilateral femoral hernia, with obstruction, without gangrene, not specified as recurrent: Secondary | ICD-10-CM | POA: Diagnosis not present

## 2021-11-08 LAB — BASIC METABOLIC PANEL
Anion gap: 6 (ref 5–15)
BUN: 6 mg/dL — ABNORMAL LOW (ref 8–23)
CO2: 25 mmol/L (ref 22–32)
Calcium: 10.3 mg/dL (ref 8.9–10.3)
Chloride: 109 mmol/L (ref 98–111)
Creatinine, Ser: 0.63 mg/dL (ref 0.44–1.00)
GFR, Estimated: >60 mL/min (ref >60)
Glucose, Bld: 109 mg/dL — ABNORMAL HIGH (ref 70–99)
Potassium: 3.7 mmol/L (ref 3.5–5.1)
Sodium: 140 mmol/L (ref 135–145)

## 2021-11-08 LAB — CBC
HCT: 34.1 % — ABNORMAL LOW (ref 36.0–46.0)
Hemoglobin: 11.2 g/dL — ABNORMAL LOW (ref 12.0–15.0)
MCH: 28.6 pg (ref 26.0–34.0)
MCHC: 32.8 g/dL (ref 30.0–36.0)
MCV: 87 fL (ref 80.0–100.0)
Platelets: 256 10*3/uL (ref 150–400)
RBC: 3.92 MIL/uL (ref 3.87–5.11)
RDW: 12.4 % (ref 11.5–15.5)
WBC: 11.8 10*3/uL — ABNORMAL HIGH (ref 4.0–10.5)
nRBC: 0 % (ref 0.0–0.2)

## 2021-11-08 MED ORDER — POLYETHYLENE GLYCOL 3350 17 G PO PACK: 17.0000 g | PACK | Freq: Every day | ORAL | 0 refills | Status: DC

## 2021-11-08 MED ORDER — OXYCODONE HCL 5 MG PO TABS: 5.0000 mg | ORAL_TABLET | Freq: Four times a day (QID) | ORAL | 0 refills | Status: DC | PRN

## 2021-11-08 MED ORDER — METHOCARBAMOL 500 MG PO TABS: 500.0000 mg | ORAL_TABLET | Freq: Four times a day (QID) | ORAL | 0 refills | Status: DC | PRN

## 2021-11-08 MED ORDER — OXYCODONE HCL 5 MG PO TABS: 5.0000 mg | ORAL_TABLET | Freq: Four times a day (QID) | ORAL | Status: DC | PRN

## 2021-11-08 MED ORDER — ACETAMINOPHEN 500 MG PO TABS: 1000.0000 mg | ORAL_TABLET | Freq: Four times a day (QID) | ORAL | 0 refills | Status: AC

## 2021-11-08 MED ORDER — SENNOSIDES-DOCUSATE SODIUM 8.6-50 MG PO TABS: 2.0000 | ORAL_TABLET | Freq: Two times a day (BID) | ORAL | Status: DC

## 2021-11-08 NOTE — Discharge Instructions (Addendum)
STOOL SOFTENERS TWICE DAILY. MIRALAX 1-2 TIMES DAILY UNTIL REGULAR.  ?START FIBER SUPPLEMENTATION IN ABOUT ONE WEEK.  ? ? ?[facility]      [facility], [place] ?[phone] ? ?OPEN ABDOMINAL SURGERY: POST OP INSTRUCTIONS ? ?Always review your discharge instruction sheet given to you by the facility where your surgery was performed. ? ?IF YOU HAVE DISABILITY OR FAMILY LEAVE FORMS, YOU MUST BRING THEM TO THE OFFICE FOR PROCESSING.  PLEASE DO NOT GIVE THEM TO YOUR DOCTOR. ? ?A prescription for pain medication may be given to you upon discharge.  Take your pain medication as prescribed, if needed.  If narcotic pain medicine is not needed, then you may take acetaminophen (Tylenol) or ibuprofen (Advil) as needed. ?Take your usually prescribed medications unless otherwise directed. ?If you need a refill on your pain medication, please contact your pharmacy. They will contact our office to request authorization.  Prescriptions will not be filled after 5pm or on week-ends. ?You should follow a light diet the first few days after arrival home, such as soup and crackers, pudding, etc.unless your doctor has advised otherwise. A high-fiber, low fat diet can be resumed as tolerated.   Be sure to include lots of fluids daily. Most patients will experience some swelling and bruising on the chest and neck area.  Ice packs will help.  Swelling and bruising can take several days to resolve ?Most patients will experience some swelling and bruising in the area of the incision. Ice pack will help. Swelling and bruising can take several days to resolve.Marland Kitchen  ?It is common to experience some constipation if taking pain medication after surgery.  Increasing fluid intake and taking a stool softener will usually help or prevent this problem from occurring.  A mild laxative (Milk of Magnesia or Miralax) should be taken according to package directions if there are no bowel movements after 48 hours. ? You may have steri-strips (small skin tapes)  in place directly over the incision.  These strips should be left on the skin for 7-10 days.  If your surgeon used skin glue on the incision, you may shower in 24 hours.  The glue will flake off over the next 2-3 weeks.  Any sutures or staples will be removed at the office during your follow-up visit. You may find that a light gauze bandage over your incision may keep your staples from being rubbed or pulled. You may shower and replace the bandage daily. ?ACTIVITIES:  You may resume regular (light) daily activities beginning the next day--such as daily self-care, walking, climbing stairs--gradually increasing activities as tolerated.  You may have sexual intercourse when it is comfortable.  Refrain from any heavy lifting or straining until approved by your doctor. ?You may drive when you no longer are taking prescription pain medication, you can comfortably wear a seatbelt, and you can safely maneuver your car and apply brakes ?Return to Work: ___________________________________ ?You should see your doctor in the office for a follow-up appointment approximately two weeks after your surgery.  Make sure that you call for this appointment within a day or two after you arrive home to insure a convenient appointment time. ?OTHER INSTRUCTIONS:  ?_____________________________________________________________ ?_____________________________________________________________ ? ?WHEN TO CALL YOUR DOCTOR: ?Fever over 101.0 ?Inability to urinate ?Nausea and/or vomiting ?Extreme swelling or bruising ?Continued bleeding from incision. ?Increased pain, redness, or drainage from the incision. ?Difficulty swallowing or breathing ?Muscle cramping or spasms. ?Numbness or tingling in hands or feet or around lips. ? ?The clinic staff is available  to answer your questions during regular business hours.  Please don?t hesitate to call and ask to speak to one of the nurses if you have concerns. ? ?For further questions, please visit  [email or web address]  ? ? ? ? ?

## 2021-11-08 NOTE — Progress Notes (Signed)
Mobility Specialist Progress Note: ? ? 11/08/21 1004  ?Mobility  ?Activity Ambulated with assistance in hallway  ?Level of Assistance Independent  ?Assistive Device None  ?Distance Ambulated (ft) 1120 ft  ?Activity Response Tolerated well  ?$Mobility charge 1 Mobility  ? ?Pt received coming out of bathroom. Complaints of incision site being sore and puling at times. Left in bed with call bell in reach and all needs met. ? ?Jaliel Deavers ?Mobility Specialist ?Primary Phone [phone] ? ?

## 2021-11-08 NOTE — Op Note (Signed)
Date: 11/08/21 ? ?Patient: Kaitlyn Dominguez ?MRN: [id number] ? ?Preoperative Diagnosis: Incarcerated right inguinal vs femoral hernia ?Postoperative Diagnosis: Incarcerated right femoral hernia ? ?Procedure:  ?Open right femoral hernia repair with mesh plug ?Diagnostic laparoscopy ? ?Surgeon: Sophronia Simas, MD ? ?EBL: Minimal ? ?Anesthesia: General endotracheal ? ?Specimens: None ? ?Indications: Ms. Hubers is a 66 year old female who presented to the ED with acute lower abdominal pain, nausea, vomiting.  A CT scan showed a right inguinal hernia containing a loop of small bowel, with an associated small bowel obstruction.  The hernia was not able to be manually reduced in the ED.  The patient was brought to the operating room emergently for hernia repair. ? ?Findings: Incarcerated femoral hernia which appeared to contain omental fat and was only able to be reduced by opening the inguinal floor.  This was repaired with a mesh plug.  Diagnostic laparoscopy was performed to confirm that there was no small bowel ischemia given the preoperative imaging findings. ? ?Procedure details: Informed consent was obtained in the preoperative area prior to the procedure. The patient was brought to the operating room and placed on the table in the supine position. General anesthesia was induced and appropriate lines and drains were placed for intraoperative monitoring. Perioperative antibiotics were administered per SCIP guidelines. The abdomen was prepped and draped in the usual sterile fashion. A pre-procedure timeout was taken verifying patient identity, surgical site and procedure to be performed. ? ?A transverse skin incision was made over the right groin, starting approximately 2 fingerbreadths superior to the pubic tubercle and extending laterally toward the anterior superior iliac spine.  The subcutaneous tissue was divided with cautery to expose the external oblique fascia.  The external oblique fascia was incised sharply, and  opened with Metzenbaum scissors, taking care to to avoid injury to the ilioinguinal nerve.  The round ligament was visualized but there was no evidence of inguinal hernia or hernia sac within the inguinal canal.  There was a mass protruding deep to the inguinal ligament and medial to the femoral vessels, consistent with a femoral hernia.  The hernia sac was able to be circumferentially dissected out of the surrounding tissue inferiorly to the inguinal ligament.  The sac appeared to contain omental fat but there was no bowel visible.  Reduction of the sac was attempted but was not possible because the neck of the sac was very tight at the inguinal ligament.  Thus the decision was made to open the inguinal floor.  This was done by dividing the inguinal ligament.  This allowed reduction of the hernia sac and its contents, and the hernia defect could be easily visualized.  Because small bowel been present within the hernia on the preoperative imaging, the decision was made to perform a diagnostic laparoscopy to confirm that there was no ischemic small bowel.  A small infraumbilical skin incision was made, the umbilical stalk was grasped and elevated, and a Veress needle was inserted through the fascia.  Intraperitoneal placement was confirmed with the saline drop test and the abdomen was insufflated.  An additional 5 mm port was placed in the right mid abdomen under direct visualization.  The small bowel was examined and there were no signs of injury or ischemia.  There is no evidence of inguinal hernia, and the femoral hernia sac was visible and intact.  The ports were removed and the abdomen was desufflated. ? ?The hernia defect was again examined through the inguinal incision. The defect was closed working medially  to laterally using interrupted 0 Novafil sutures to sew the conjoint tendon to Cooper's ligament. At the completion of this closure, there remained a small defect laterally that was not amenable to primary  closure due to the proximity of the femoral vessels. Thus the decision was made to place a mesh plug. A sheet of Ultrapro mesh was brought onto the field and a small square was cut and folded to form a small plug. The plug was placed into the remaining hernia defect and secured to the conjoint tendon with a 0 Novafil suture. The wound was irrigated and appeared hemostatic. The inguinal floor was closed with interrupted 3-0 Vicryl suture. The external oblique fascia and Scarpa's layer were closed with running 2-0 Vicryl suture. The skin was reapproximated with interrupted 3-0 Vicryl deep dermal sutures and closed with running subcuticular 4-0 monocryl suture. Dermabond was applied. ? ?The patient tolerated the procedure well with no apparent complications. All counts were correct x2 at the end of the procedure. The patient was extubated and taken to PACU in stable condition. ? ?Sophronia Simas, MD ?11/08/21 ?7:35 AM ? ? ?

## 2021-11-08 NOTE — Plan of Care (Signed)
  Problem: Pain Managment: Goal: General experience of comfort will improve Outcome: Progressing   Problem: Safety: Goal: Ability to remain free from injury will improve Outcome: Progressing   Problem: Skin Integrity: Goal: Risk for impaired skin integrity will decrease Outcome: Progressing   

## 2021-11-15 ENCOUNTER — Ambulatory Visit: Payer: Self-pay

## 2021-11-15 ENCOUNTER — Ambulatory Visit (INDEPENDENT_AMBULATORY_CARE_PROVIDER_SITE_OTHER): Payer: BC Managed Care – PPO | Admitting: Family Medicine

## 2021-11-15 VITALS — BP 96/70 | HR 67 | Ht 67.0 in | Wt 133.0 lb

## 2021-11-15 DIAGNOSIS — M170 Bilateral primary osteoarthritis of knee: Secondary | ICD-10-CM | POA: Diagnosis not present

## 2021-11-15 DIAGNOSIS — M25512 Pain in left shoulder: Secondary | ICD-10-CM

## 2021-11-15 NOTE — Assessment & Plan Note (Signed)
Bilateral injections given today, tolerated the procedure well, patient would just like to feel somewhat better.  Patient has not been able to be as active with her having the pain recently.  Discussed which activities to do and which ones to avoid, increase activity slowly.  Follow-up again in 6 to 8 weeks. ?Could be a candidate again for viscosupplementation. ?

## 2021-11-15 NOTE — Progress Notes (Signed)
?Charlann Boxer D.O. ?[facility] ?[place] ?Phone: [phone] ?Subjective:   ?I, Kaitlyn Dominguez, am serving as a scribe for Dr. Hulan Saas. ? ?This visit occurred during the SARS-CoV-2 public health emergency.  Safety protocols were in place, including screening questions prior to the visit, additional usage of staff PPE, and extensive cleaning of exam room while observing appropriate contact time as indicated for disinfecting solutions.  ? ? ?I'm seeing this patient by the request  of:  Harlan Stains, MD ? ?CC: Bilateral knee pain ? ?[id number]  ?09/26/2021 ?Known CPPD as well as arthritic changes of the knee noted.  Discussed icing regimen and home exercises, discussed which activities discussed the potential for advanced imaging as well for the knee.  A monitor.  We will follow-up again in 2 to 3 months. ? ?Update 11/15/2021 ?Kaitlyn Dominguez is a 66 y.o. female coming in with complaint of L shoulder and B knee pain. Patient states that her shoulder is fine. Continued knee pain. Feels like L is swollen and has decreased ROM. Has tried to hike since last visit and had pain the entire time.  Patient states that it is starting to affect daily activities again.  Patient has not been very active but would like to be so.  Feels like her knees would not allow her to be. ? ?  ? ?Past Medical History:  ?Diagnosis Date  ? Anxiety   ? Osteoarthritis   ? PONV (postoperative nausea and vomiting)   ? had emesis after surgery when went home  ? Serum calcium elevated   ? Vaginal atrophy   ? ?Past Surgical History:  ?Procedure Laterality Date  ? COLONOSCOPY    ? FOOT SURGERY    ? left; on multiple occasions  ? INGUINAL HERNIA REPAIR N/A 11/07/2021  ? Procedure: HERNIA REPAIR INGUINAL INCARCERATED;  Surgeon: Dwan Bolt, MD;  Location: [facility];  Service: General;  Laterality: N/A;  ? KNEE ARTHROSCOPY  2001, 2005  ? left x 2  ? LAPAROSCOPY N/A 11/07/2021  ? Procedure: LAPAROSCOPY  DIAGNOSTIC;  Surgeon: Dwan Bolt, MD;  Location: [facility];  Service: General;  Laterality: N/A;  ? SHOULDER ARTHROSCOPY WITH ROTATOR CUFF REPAIR AND SUBACROMIAL DECOMPRESSION Right 12/01/2019  ? Procedure: SHOULDER ARTHROSCOPY WITH ROTATOR CUFF REPAIR, DISTAL CLAVICLE EXCISION;  Surgeon: Tania Ade, MD;  Location: [facility];  Service: Orthopedics;  Laterality: Right;  ? SUBACROMIAL DECOMPRESSION Right 12/01/2019  ? Procedure: SUBACROMIAL DECOMPRESSION;  Surgeon: Tania Ade, MD;  Location: [facility];  Service: Orthopedics;  Laterality: Right;  ? ?Social History  ? ?Socioeconomic History  ? Marital status: Legally Separated  ?  Spouse name: Not on file  ? Number of children: 3  ? Years of education: Not on file  ? Highest education level: Not on file  ?Occupational History  ?  Employer: [employer]  ?Tobacco Use  ? Smoking status: Never  ? Smokeless tobacco: Never  ?Vaping Use  ? Vaping Use: Never used  ?Substance and Sexual Activity  ? Alcohol use: Yes  ?  Comment: 1 drink daily  ? Drug use: No  ? Sexual activity: Not on file  ?Other Topics Concern  ? Not on file  ?Social History Narrative  ? Not on file  ? ?Social Determinants of Health  ? ?Financial Resource Strain: Not on file  ?Food Insecurity: Not on file  ?Transportation Needs: Not on file  ?Physical Activity:  Not on file  ?Stress: Not on file  ?Social Connections: Not on file  ? ?Allergies  ?Allergen Reactions  ? Penicillins   ?  REACTION: hives  ? ?Family History  ?Problem Relation Age of Onset  ? Heart attack Mother   ? Heart disease Maternal Grandmother   ? Alzheimer's disease Father   ? Colon cancer Neg Hx   ? Esophageal cancer Neg Hx   ? Rectal cancer Neg Hx   ? Stomach cancer Neg Hx   ? ? ? ? ? ?Current Outpatient Medications (Analgesics):  ?  acetaminophen (TYLENOL) 500 MG tablet, Take 2 tablets (1,000 mg total) by mouth every 6 (six) hours. ?  celecoxib (CELEBREX) 200 MG capsule,  One to 2 tablets by mouth daily as needed for pain. (Patient taking differently: Take 100 mg by mouth daily. One to 2 tablets by mouth daily as needed for pain.) ?  colchicine 0.6 MG tablet, Take 1 tablet (0.6 mg total) by mouth 2 (two) times daily. ?  oxyCODONE (OXY IR/ROXICODONE) 5 MG immediate release tablet, Take 1 tablet (5 mg total) by mouth every 6 (six) hours as needed for moderate pain, severe pain or breakthrough pain (pain not releived by tylenol, robaxin, heat/ice). ? ? ?Current Outpatient Medications (Other):  ?  cholecalciferol (VITAMIN D3) 25 MCG (1000 UNIT) tablet, Take 1,000 Units by mouth daily. ?  LORazepam (ATIVAN) 0.5 MG tablet, Take 0.5 mg by mouth 2 (two) times daily as needed. ?  methocarbamol (ROBAXIN) 500 MG tablet, Take 1 tablet (500 mg total) by mouth every 6 (six) hours as needed for muscle spasms. ?  Omega-3 Fatty Acids (FISH OIL PO), Take 1 capsule by mouth daily. ?  polyethylene glycol (MIRALAX / GLYCOLAX) 17 g packet, Take 17 g by mouth daily. ?  senna-docusate (SENOKOT-S) 8.6-50 MG tablet, Take 2 tablets by mouth 2 (two) times daily. ?  venlafaxine (EFFEXOR) 37.5 MG tablet, Take 37.5 mg by mouth daily. ? ? ?Reviewed prior external information including notes and imaging from  ?primary care provider ?As well as notes that were available from care everywhere and other healthcare systems. ? ?Past medical history, social, surgical and family history all reviewed in electronic medical record.  No pertanent information unless stated regarding to the chief complaint.  ? ?Review of Systems: ? No headache, visual changes, nausea, vomiting, diarrhea, constipation, dizziness, abdominal pain, skin rash, fevers, chills, night sweats, weight loss, swollen lymph nodes, body aches, joint swelling, chest pain, shortness of breath, mood changes. POSITIVE muscle aches ? ?Objective  ?Blood pressure 96/70, pulse 67, height 5\' 7"  (1.702 m), weight 133 lb (60.3 kg), SpO2 99 %. ?  ?General: No apparent  distress alert and oriented x3 mood and affect normal, dressed appropriately.  ?HEENT: Pupils equal, extraocular movements intact  ?Respiratory: Patient's speak in full sentences and does not appear short of breath  ?Cardiovascular: No lower extremity edema, non tender, no erythema  ?Gait normal with good balance and coordination.  ?MSK: Knee exam bilaterally do have trace effusion noted of the patellofemoral joint.  Lateral tracking noted of the patella.  Patient does have some very mild instability noted with valgus and varus force. ? ?After informed written and verbal consent, patient was seated on exam table. Right knee was prepped with alcohol swab and utilizing anterolateral approach, patient's right knee space was injected with 4:1  marcaine 0.5%: Kenalog 40mg /dL. Patient tolerated the procedure well without immediate complications. ? ?After informed written and verbal consent, patient was seated  on exam table. Left knee was prepped with alcohol swab and utilizing anterolateral approach, patient's left knee space was injected with 4:1  marcaine 0.5%: Kenalog 40mg /dL. Patient tolerated the procedure well without immediate complications. ? ?  ?Impression and Recommendations:  ?  ? ?The above documentation has been reviewed and is accurate and complete Lyndal Pulley, DO ? ? ? ?

## 2021-11-15 NOTE — Patient Instructions (Signed)
Good to see you  ?Ice 20 minutes 2 times daily. Usually after activity and before bed. ?Exercises 3 times a week.  ?Take time for yourself ?We will get gel approved ?See me again in 6 weeks ?

## 2021-11-17 DIAGNOSIS — F419 Anxiety disorder, unspecified: Secondary | ICD-10-CM | POA: Diagnosis not present

## 2021-11-23 NOTE — Discharge Summary (Signed)
[facility] ?Discharge Summary  ? ?Patient ID: ?Kaitlyn Dominguez ?MRN: [id number] ?DOB/AGE: 1956-08-08 66 y.o. ? ?Admit date: 11/06/2021 ?Discharge date: 11/08/2021 ? ?Admitting Diagnosis: ?Right femoral hernia  ? ?Discharge Diagnosis ?Patient Active Problem List  ? Diagnosis Date Noted  ? Femoral hernia of right side with obstruction and without gangrene 11/07/2021  ? Left cervical radiculopathy 06/29/2021  ? Hypercalcemia 03/02/2021  ? Degenerative arthritis of knee 12/07/2020  ? Calcium pyrophosphate deposition disease (CPPD) 12/07/2020  ? Rotator cuff tear, right 11/13/2019  ? Subdeltoid bursitis of right shoulder joint 08/05/2019  ? AC (acromioclavicular) arthritis 08/05/2019  ? ALLERGIC RHINITIS 04/08/2007  ? ? ?Consultants ?None  ?Imaging: ?CT ABD PELVIS 11/06/21 ?IMPRESSION: ?1. Small bowel obstruction secondary to the presence of a 4.0 cm x ?3.8 cm right inguinal hernia. ?2. Moderate-sized hiatal hernia. ? ?Procedures ?Dr. Sophronia Simas 11/07/21 - Procedure:  ?Open right femoral hernia repair with mesh plug ?Diagnostic laparoscopy ? ?Hospital Course:  ?Kaitlyn Dominguez is a 66 year old female who presented to the ED with acute lower abdominal pain, nausea, vomiting.  A CT scan showed a right inguinal hernia containing a loop of small bowel, with an associated small bowel obstruction.  The hernia was not able to be manually reduced in the ED.  The patient was brought to the operating room emergently for hernia repair. Tolerated procedure well and was transferred to the floor.  Diet was advanced as tolerated.  On POD#1, the patient was voiding well, tolerating diet, ambulating well, pain well controlled, vital signs stable, incisions c/d/i and felt stable for discharge.  Patient will follow up in our office as below and knows to call with questions or concerns.  ?Physical Exam: ?General:  Alert, NAD, pleasant, comfortable ?Abd:  Soft, ND, mild tenderness, incisions C/D/I ? ?Allergies as of 11/08/2021   ? ?    Reactions  ? Penicillins   ? REACTION: hives  ? ?  ? ?  ?Medication List  ?  ? ?STOP taking these medications   ? ?DOCULASE PO ?  ?gabapentin 100 MG capsule ?Commonly known as: NEURONTIN ?  ?ibuprofen 200 MG tablet ?Commonly known as: ADVIL ?  ?magnesium citrate Soln ?  ?Pennsaid 2 % Soln ?Generic drug: Diclofenac Sodium ?  ?polyethylene glycol powder 17 GM/SCOOP powder ?Commonly known as: GLYCOLAX/MIRALAX ?Replaced by: polyethylene glycol 17 g packet ?  ?predniSONE 20 MG tablet ?Commonly known as: DELTASONE ?  ?senna 8.6 MG Tabs tablet ?Commonly known as: SENOKOT ?  ? ?  ? ?TAKE these medications   ? ?acetaminophen 500 MG tablet ?Commonly known as: TYLENOL ?Take 2 tablets (1,000 mg total) by mouth every 6 (six) hours. ?  ?celecoxib 200 MG capsule ?Commonly known as: CeleBREX ?One to 2 tablets by mouth daily as needed for pain. ?What changed:  ?how much to take ?how to take this ?when to take this ?  ?cholecalciferol 25 MCG (1000 UNIT) tablet ?Commonly known as: VITAMIN D3 ?Take 1,000 Units by mouth daily. ?  ?colchicine 0.6 MG tablet ?Take 1 tablet (0.6 mg total) by mouth 2 (two) times daily. ?  ?FISH OIL PO ?Take 1 capsule by mouth daily. ?  ?LORazepam 0.5 MG tablet ?Commonly known as: ATIVAN ?Take 0.5 mg by mouth 2 (two) times daily as needed. ?  ?methocarbamol 500 MG tablet ?Commonly known as: ROBAXIN ?Take 1 tablet (500 mg total) by mouth every 6 (six) hours as needed for muscle spasms. ?  ?oxyCODONE 5 MG immediate release tablet ?Commonly known as: Oxy  IR/ROXICODONE ?Take 1 tablet (5 mg total) by mouth every 6 (six) hours as needed for moderate pain, severe pain or breakthrough pain (pain not releived by tylenol, robaxin, heat/ice). ?  ?polyethylene glycol 17 g packet ?Commonly known as: MIRALAX / GLYCOLAX ?Take 17 g by mouth daily. ?Replaces: polyethylene glycol powder 17 GM/SCOOP powder ?  ?senna-docusate 8.6-50 MG tablet ?Commonly known as: Senokot-S ?Take 2 tablets by mouth 2 (two) times daily. ?   ?venlafaxine 37.5 MG tablet ?Commonly known as: EFFEXOR ?Take 37.5 mg by mouth daily. ?  ? ?  ? ? ? ? Follow-up Information   ? ? Kaitlyn Mandes, MD Follow up.   ?Specialty: General Surgery ?Why: out office is scheduling you for post-operative follow up in about 4 weeks. please call to confirm appointment date/time. ?Contact information: ?[address] ?[address] ?[place] [address] ?[phone] ? ? ?  ?  ? ?  ?  ? ?  ? ? ?Signed: ?Hosie Spangle, PA-C ?[facility] ?11/23/2021, 3:34 PM ? ?

## 2021-11-28 DIAGNOSIS — Z0000 Encounter for general adult medical examination without abnormal findings: Secondary | ICD-10-CM | POA: Diagnosis not present

## 2021-11-28 DIAGNOSIS — Z13220 Encounter for screening for lipoid disorders: Secondary | ICD-10-CM | POA: Diagnosis not present

## 2021-11-28 DIAGNOSIS — R8290 Unspecified abnormal findings in urine: Secondary | ICD-10-CM | POA: Diagnosis not present

## 2021-11-30 ENCOUNTER — Other Ambulatory Visit: Payer: Self-pay | Admitting: Family Medicine

## 2021-11-30 DIAGNOSIS — E2839 Other primary ovarian failure: Secondary | ICD-10-CM

## 2021-12-01 ENCOUNTER — Encounter: Payer: Self-pay | Admitting: Family Medicine

## 2021-12-07 ENCOUNTER — Encounter: Payer: Self-pay | Admitting: Nurse Practitioner

## 2021-12-19 ENCOUNTER — Telehealth: Payer: Self-pay | Admitting: Family Medicine

## 2021-12-19 NOTE — Telephone Encounter (Signed)
Patient called and asked for me to let Dr Katrinka Blazing know that her son passed away. Because of this, she has had to move her appointment out. ? ?Just FYI. ? ?

## 2021-12-22 ENCOUNTER — Ambulatory Visit: Payer: BC Managed Care – PPO | Admitting: Nurse Practitioner

## 2021-12-26 NOTE — Progress Notes (Signed)
? ? ? ?12/28/2021 ?Wiliam Ke ?[id number] ?Aug 01, 1956 ? ?Referring provider: Laurann Montana, MD ?Primary GI doctor: Dr. Myrtie Neither ? ?ASSESSMENT AND PLAN:  ? ?Chronic idiopathic constipation ?05/02/2018 colonoscopy excellent bowel prep with 2 days Suprep/MiraLAX diverticulosis, redundant colon recall 10 years. ?S/p femoral hernia repair and death of her son in 2022-12-05 ?No obstructed symptoms.  ? ?Possible combination of slow transit/ component of pelvis floor dysfunction with history and symptoms. ?Will treat with linzess samples, fiber, and squatty potty.  ?If not helping can consider sitz markers versus anal manometry.  ?Likely component of gut brain interaction with son's recent passing, continue counseling.  ?Follow up 4-6 weeks, call or message sooner.  ?-     linaclotide (LINZESS) 145 MCG CAPS capsule; Take 1 capsule (145 mcg total) by mouth daily before breakfast. [id number], EXP: 10-2022 ?-     linaclotide (LINZESS) 290 MCG CAPS capsule; Take 1 capsule (290 mcg total) by mouth daily before breakfast. LOT: [id number], eXP: 02-2022 ? ? ?Patient Care Team: ?Laurann Montana, MD as PCP - General (Family Medicine) ? ?HISTORY OF PRESENT ILLNESS: ?66 y.o. female with a past medical history of anxiety, elevated calcium, chronic constipation, hemorrhoids and others listed below presents for evaluation of change in bowel habits. ? ?05/02/2018 colonoscopy excellent bowel prep with 2 days Suprep/MiraLAX diverticulosis, redundant colon recall 10 years. ? ?Remote eating disorder history.  ?Lost her son in 12/05/22  and lost her daughter 30 + years ago 66 months old.  ?She states she was straining more in the spring, had pencil thin stool, did yoga and then had incarcerated femoral hernia repair March 26th.  ?She was on opioids short term after repair.  ?Has add on trazodone and increased ativan use since son passing. ? ?She takes magnesium, probiotic and senna 2 x a day 3-4 tablets, since the surgery. With this she is having a BM once a day.   ?No longer pencil thin stools. No melena, hematochezia.  ?No nausea, vomiting, GERD or AB pain.  ?She states has gotten softer lately.  ?She eats a lot of fruit and flax seed, stays hydrated.  ?She has moderate hiatal hernia.  ? ?Current Medications:  ? ? ? ? ?Current Outpatient Medications (Analgesics):  ?  acetaminophen (TYLENOL) 500 MG tablet, Take 2 tablets (1,000 mg total) by mouth every 6 (six) hours. (Patient taking differently: Take 1,000 mg by mouth as needed.) ?  celecoxib (CELEBREX) 200 MG capsule, One to 2 tablets by mouth daily as needed for pain. (Patient taking differently: Take 100 mg by mouth daily. One to 2 tablets by mouth daily as needed for pain.) ?  colchicine 0.6 MG tablet, Take 1 tablet (0.6 mg total) by mouth 2 (two) times daily. (Patient not taking: Reported on 12/28/2021) ?  oxyCODONE (OXY IR/ROXICODONE) 5 MG immediate release tablet, Take 1 tablet (5 mg total) by mouth every 6 (six) hours as needed for moderate pain, severe pain or breakthrough pain (pain not releived by tylenol, robaxin, heat/ice). (Patient not taking: Reported on 12/28/2021) ? ? ?Current Outpatient Medications (Other):  ?  cholecalciferol (VITAMIN D3) 25 MCG (1000 UNIT) tablet, Take 1,000 Units by mouth daily. ?  linaclotide (LINZESS) 145 MCG CAPS capsule, Take 1 capsule (145 mcg total) by mouth daily before breakfast. [id number], EXP: 10-2022 ?  linaclotide (LINZESS) 290 MCG CAPS capsule, Take 1 capsule (290 mcg total) by mouth daily before breakfast. LOT: [id number], eXP: 02-2022 ?  LORazepam (ATIVAN) 0.5 MG tablet, Take 0.5 mg by mouth 2 (two)  times daily as needed. ?  Magnesium Citrate 100 MG CAPS, Take 400 mg by mouth. ?  methocarbamol (ROBAXIN) 500 MG tablet, Take 1 tablet (500 mg total) by mouth every 6 (six) hours as needed for muscle spasms. ?  Omega-3 Fatty Acids (FISH OIL PO), Take 1 capsule by mouth daily. ?  traZODone (DESYREL) 100 MG tablet, Take 100 mg by mouth at bedtime. ?  venlafaxine (EFFEXOR) 37.5 MG tablet,  Take 37.5 mg by mouth daily. ?  polyethylene glycol (MIRALAX / GLYCOLAX) 17 g packet, Take 17 g by mouth daily. (Patient not taking: Reported on 12/28/2021) ? ?Medical History:  ?Past Medical History:  ?Diagnosis Date  ? Anxiety   ? History of femoral hernia repair 11/07/2021  ? Osteoarthritis   ? PONV (postoperative nausea and vomiting)   ? had emesis after surgery when went home  ? Serum calcium elevated   ? Vaginal atrophy   ? ?Allergies:  ?Allergies  ?Allergen Reactions  ? Penicillins   ?  REACTION: hives  ?  ? ?Surgical History:  ?She  has a past surgical history that includes Foot surgery; Knee arthroscopy (2001, 2005); Colonoscopy; Shoulder arthroscopy with rotator cuff repair and subacromial decompression (Right, 12/01/2019); Subacromial decompression (Right, 12/01/2019); laparoscopy (N/A, 11/07/2021); and Inguinal hernia repair (N/A, 11/07/2021). ?Family History:  ?Her family history includes Alzheimer's disease in her father; Heart attack in her mother; Heart disease in her maternal grandmother. ?Social History:  ? reports that she has never smoked. She has never used smokeless tobacco. She reports current alcohol use of about 3.0 standard drinks per week. She reports that she does not use drugs. ? ?REVIEW OF SYSTEMS  : All other systems reviewed and negative except where noted in the History of Present Illness. ? ? ?PHYSICAL EXAM: ?BP 106/64   Pulse 78   Ht 5\' 7"  (1.702 m)   Wt 134 lb 12.8 oz (61.1 kg)   SpO2 99%   BMI 21.11 kg/m?  ?General:   Pleasant, well developed female in no acute distress ?Head:   Normocephalic and atraumatic. ?Eyes:  sclerae anicteric,conjunctive pink  ?Heart:   regular rate and rhythm ?Pulm:  Clear anteriorly; no wheezing ?Abdomen:   Soft, Flat AB, Sluggish bowel sounds. mild tenderness in the lower abdomen. Without guarding and Without rebound, No organomegaly appreciated. ?Rectal: Not evaluated ?Extremities:  Without edema. ?Msk: Symmetrical without gross deformities.  Peripheral pulses intact.  ?Neurologic:  Alert and  oriented x4;  No focal deficits.  ?Skin:   Dry and intact without significant lesions or rashes. ?Psychiatric:  Cooperative. Normal mood and affect. ? ? ? ? , PA-C ?10:54 AM ? ? ?

## 2021-12-28 ENCOUNTER — Encounter: Payer: Self-pay | Admitting: Physician Assistant

## 2021-12-28 ENCOUNTER — Ambulatory Visit (INDEPENDENT_AMBULATORY_CARE_PROVIDER_SITE_OTHER): Payer: BC Managed Care – PPO | Admitting: Physician Assistant

## 2021-12-28 VITALS — BP 106/64 | HR 78 | Ht 67.0 in | Wt 134.8 lb

## 2021-12-28 DIAGNOSIS — K5904 Chronic idiopathic constipation: Secondary | ICD-10-CM | POA: Diagnosis not present

## 2021-12-28 MED ORDER — LINACLOTIDE 145 MCG PO CAPS: 145.0000 ug | ORAL_CAPSULE | Freq: Every day | ORAL | 0 refills | Status: DC

## 2021-12-28 MED ORDER — LINACLOTIDE 290 MCG PO CAPS: 290.0000 ug | ORAL_CAPSULE | Freq: Every day | ORAL | 0 refills | Status: DC

## 2021-12-28 NOTE — Patient Instructions (Addendum)
If you are age 64 or older, your body mass index should be between 23-30. Your Body mass index is 21.11 kg/m?Marland Kitchen If this is out of the aforementioned range listed, please consider follow up with your Primary Care Provider. ? ?If you are age 37 or younger, your body mass index should be between 19-25. Your Body mass index is 21.11 kg/m?Marland Kitchen If this is out of the aformentioned range listed, please consider follow up with your Primary Care Provider.  ? ?________________________________________________________ ? ?The [facility] providers would like to encourage you to use [facility] to communicate with providers for non-urgent requests or questions.  Due to long hold times on the telephone, sending your provider a message by [facility] may be a faster and more efficient way to get a response.  Please allow 48 business hours for a response.  Please remember that this is for non-urgent requests.  ?_______________________________________________________ ? ?Linzess 145 mcg and 290 mcg Samples ?Take at least 30 minutes before the first meal of the day on an empty stomach ?You can have a loose stool if you eat a high-fat breakfast. ?Give it at least 4 days, may have more bowel movements during that time.  ?If you continue to have severe diarrhea try every other day, if you get AB pain with it stop.  ?After you are out we can send in a prescription if you did well, there is a prescription card ? ?Recommend starting on a fiber supplement, can try metamucil first but if this causes gas/bloating switch to benefiber ?Take with fiber with with a full 8 oz glass of water once a day. This can take 1 month to start helping, so try for at least one month.  ?Recommend increasing water and activity.  ?Get a squatty potty to use at home or try a stool, goal is to get your knees above your hips during a bowel movement. ? ?- Drink at least 64-80 ounces of water/liquid per day. ?- Establish a time to try to move your bowels every day.  For many people,  this is after a cup of coffee or after a meal such as breakfast. ?- Sit all of the way back on the toilet keeping your back fairly straight and while sitting up, try to rest the tops of your forearms on your upper thighs.   ?- Raising your feet with a step stool/squatty potty can be helpful to improve the angle that allows your stool to pass through the rectum. ?- Relax the rectum feeling it bulge toward the toilet water.  If you feel your rectum raising toward your body, you are contracting rather than relaxing. ?- Breathe in and slowly exhale. "Belly breath" by expanding your belly towards your belly button. Keep belly expanded as you gently direct pressure down and back to the anus.  A low pitched GRRR sound can assist with increasing intra-abdominal pressure.  ?- Repeat 3-4 times. If unsuccessful, contract the pelvic floor to restore normal tone and get off the toilet.  Avoid excessive straining. ?- To reduce excessive wiping by teaching your anus to normally contract, place hands on outer aspect of knees and resist knee movement outward.  Hold 5-10 second then place hands just inside of knees and resist inward movement of knees.  Hold 5 seconds.  Repeat a few times each way. ? ?Go to the ER if unable to pass gas, severe AB pain, unable to hold down food, any shortness of breath of chest pain.  ? ?You have been  scheduled for a follow up appointment on Wednesday, 02-08-22 at 8:30 am with Quentin Mulling, PA. ? ?Stop Senokot daily. Use only as needed. ?MiraLAX daily. ? ? ?Here some information about pelvic floor dysfunction. ?This may be contributing to some of your symptoms. ?We will continue with our evaluation but I do want you to consider adding on fiber supplement with low-dose  ?We could also refer to pelvic floor physical therapy. ? ? ?Pelvic Floor Dysfunction, Female ?Pelvic floor dysfunction (PFD) is a condition that results when the group of muscles and connective tissues that support the organs in the  pelvis (pelvic floor muscles) do not work well. These muscles and their connections form a sling that supports the colon and bladder. In women, they also support the uterus. ?PFD causes pelvic floor muscles to be too weak, too tight, or both. In PFD, muscle movements are not coordinated. This may cause bowel or bladder problems. It may also cause pain. ?What are the causes? ?This condition may be caused by an injury to the pelvic area or by a weakening of pelvic muscles. This often results from pregnancy and childbirth or other types of strain. In many cases, the exact cause is not known. ?What increases the risk? ?The following factors may make you more likely to develop this condition: ?Having chronic bladder tissue inflammation (interstitial cystitis). ?Being an older person. ?Being overweight. ?History of radiation treatment for cancer in the pelvic region. ?Previous pelvic surgery, such as removal of the uterus (hysterectomy). ?What are the signs or symptoms? ?Symptoms of this condition vary and may include: ?Bladder symptoms, such as: ?Trouble starting urination and emptying the bladder. ?Frequent urinary tract infections. ?Leaking urine when coughing, laughing, or exercising (stress incontinence). ?Having to pass urine urgently or frequently. ?Pain when passing urine. ?Bowel symptoms, such as: ?Constipation. ?Urgent or frequent bowel movements. ?Incomplete bowel movements. ?Painful bowel movements. ?Leaking stool or gas. ?Unexplained genital or rectal pain. ?Genital or rectal muscle spasms. ?Low back pain. ?Other symptoms may include: ?A heavy, full, or aching feeling in the vagina. ?A bulge that protrudes into the vagina. ?Pain during or after sex. ?How is this diagnosed? ?This condition may be diagnosed based on: ?Your symptoms and medical history. ?A physical exam. During the exam, your health care provider may check your pelvic muscles for tightness, spasm, pain, or weakness. This may include a rectal  exam and a pelvic exam. ?In some cases, you may have diagnostic tests, such as: ?Electrical muscle function tests. ?Urine flow testing. ?X-ray tests of bowel function. ?Ultrasound of the pelvic organs. ?How is this treated? ?Treatment for this condition depends on the symptoms. Treatment options include: ?Physical therapy. This may include Kegel exercises to help relax or strengthen the pelvic floor muscles. ?Biofeedback. This type of therapy provides feedback on how tight your pelvic floor muscles are so that you can learn to control them. ?Internal or external massage therapy. ?A treatment that involves electrical stimulation of the pelvic floor muscles to help control pain (transcutaneous electrical nerve stimulation, or TENS). ?Sound wave therapy (ultrasound) to reduce muscle spasms. ?Medicines, such as: ?Muscle relaxants. ?Bladder control medicines. ?Surgery to reconstruct or support pelvic floor muscles may be an option if other treatments do not help. ?Follow these instructions at home: ?Activity ?Do your usual activities as told by your health care provider. Ask your health care provider if you should modify any activities. ?Do pelvic floor strengthening or relaxing exercises at home as told by your physical therapist. ?Lifestyle ?Maintain a healthy  weight. ?Eat foods that are high in fiber, such as beans, whole grains, and fresh fruits and vegetables. ?Limit foods that are high in fat and processed sugars, such as fried or sweet foods. ?Manage stress with relaxation techniques such as yoga or meditation. ?General instructions ?If you have problems with leakage: ?Use absorbable pads or wear padded underwear. ?Wash frequently with mild soap. ?Keep your genital and anal area as clean and dry as possible. ?Ask your health care provider if you should try a barrier cream to prevent skin irritation. ?Take warm baths to relieve pelvic muscle tension or spasms. ?Take over-the-counter and prescription medicines only  as told by your health care provider. ?Keep all follow-up visits. ?How is this prevented? ?The cause of PFD is not always known, but there are a few things you can do to reduce the risk of developing this

## 2021-12-29 ENCOUNTER — Ambulatory Visit: Payer: BC Managed Care – PPO | Admitting: Family Medicine

## 2022-01-06 ENCOUNTER — Other Ambulatory Visit: Payer: Self-pay

## 2022-01-06 ENCOUNTER — Telehealth: Payer: Self-pay | Admitting: Physician Assistant

## 2022-01-06 MED ORDER — LINACLOTIDE 290 MCG PO CAPS: 290.0000 ug | ORAL_CAPSULE | Freq: Every day | ORAL | 3 refills | Status: DC

## 2022-01-06 NOTE — Telephone Encounter (Signed)
Linzess refill ordered & patient has been notified.

## 2022-01-06 NOTE — Telephone Encounter (Signed)
Patient called stating that the Linzess 290 were working well for her, but she is out of samples.  She is requesting to be able to pick some up today, or either have a prescription called into her pharmacy.  Please call patient and advise.  Thank you.

## 2022-01-10 ENCOUNTER — Telehealth: Payer: Self-pay

## 2022-01-10 MED ORDER — TRULANCE 3 MG PO TABS: 3.0000 mg | ORAL_TABLET | Freq: Every day | ORAL | 2 refills | Status: DC

## 2022-01-10 NOTE — Telephone Encounter (Signed)
Called and spoke with patient to discuss with her trying Trulance due to her insurance coverage. She is aware of how to take it and that if it does not work to contact the office so that we can start a prior authorization to get her back on Linzess.

## 2022-01-10 NOTE — Telephone Encounter (Signed)
Fax received from patient's pharmacy about Linzess. It states that the drug is not covered.  It looks like she needs to fail Trulance in order to stay on Linzess. See below:  Criteria for Approval of Restricted Product(s): 1. The patient is 64 years of age or older: AND 2. The patient requires laxative to regularly achieve loose stools; AND 3. The patient has tried a minimum of two standard laxative therapy classes listed below: a. Stimulant laxatives (i.e., senna, bisacodyl) b. Stool softeners (i.e., docusate) c. Enemas (i.e., sodium phosphate) d. Osmotic agents (i.e., polyethylene glycol, lactulose, sorbitol); AND 4. The patient has a diagnosis of Chronic Idiopathic Constipation; AND a. The requested drug is Trulance; OR b. The requested drug is Amitiza or Lubiprostone (generic Amitiza), Linzess, or Motegrity; AND i. The patient has tried and failed or has a clinical contraindication/intolerance to Trulance; OR 5. The patient has a diagnosis of Irritable bowel syndrome with constipation; AND a. The requested drug is Trulance; OR b. The requested drug is Amitiza or Lubiprostone (generic Amitiza), Ibsrela, or Linzess; AND i. The patient has tried and failed or has a clinical contraindication/intolerance to Trulance; OR

## 2022-01-10 NOTE — Telephone Encounter (Signed)
Sent in trulance for patient to try, this is very similar to linzess. If it does not help as much can resubmit for linzess.  Thanks!

## 2022-01-11 ENCOUNTER — Other Ambulatory Visit (HOSPITAL_COMMUNITY): Payer: Self-pay

## 2022-01-11 ENCOUNTER — Telehealth: Payer: Self-pay | Admitting: Pharmacy Technician

## 2022-01-11 NOTE — Telephone Encounter (Signed)
Patient Advocate Encounter  Received notification from [facility] that prior authorization for LINZESS is required.   PA submitted on 5.31.23 Key [id number] Status is pending   [facility] will continue to follow  Ricke Hey, CPhT Patient Advocate Phone: [phone]

## 2022-01-13 ENCOUNTER — Other Ambulatory Visit (HOSPITAL_COMMUNITY): Payer: Self-pay

## 2022-01-13 ENCOUNTER — Telehealth: Payer: Self-pay | Admitting: Pharmacy Technician

## 2022-01-13 NOTE — Telephone Encounter (Signed)
Patient Advocate Encounter  Received notification from [facility] OFFICE that prior authorization for [facility] 3MG  is required.   PA submitted on 6.2.23 Key [id number] Status is pending   [facility] will continue to follow  [id number], CPhT Patient Advocate Phone: [phone]

## 2022-01-13 NOTE — Telephone Encounter (Signed)
Patient called inquiring about prior authorization for medication Trulance. Please give patient a call back to advise.  Thank You

## 2022-01-13 NOTE — Telephone Encounter (Signed)
Called and left message to let patient know that we are currently working on [employer], but since Trulance now also requires a prior auth we will have that started as well to get a medication started.

## 2022-01-13 NOTE — Telephone Encounter (Signed)
Received notification from [facility] regarding a prior authorization for LINZESS . Authorization has been APPROVED from 5.31.23 to 5.29.2024.   Per test claim, copay for 30 days supply is $0

## 2022-01-13 NOTE — Telephone Encounter (Signed)
Called and spoke with patient to advise her that the linzess has been approved , but Trulance will also have a prior auth. She will contact the pharmacy about filling the Linzess. She is fine with the possibility of both being approved.

## 2022-01-16 NOTE — Telephone Encounter (Signed)
Received a fax regarding Prior Authorization from [facility] for . Authorization has been DENIED because PT IS CURRENTLY ON LINZESS AND PT CAN NOT BE ON BOTH.

## 2022-01-17 MED ORDER — LINACLOTIDE 290 MCG PO CAPS: 290.0000 ug | ORAL_CAPSULE | Freq: Every day | ORAL | 0 refills | Status: DC

## 2022-01-17 NOTE — Telephone Encounter (Signed)
Patient will continue on Linzess. Trulance removed from chart and Linzess added back.

## 2022-01-24 NOTE — Progress Notes (Signed)
[name] [address] [place] [address] Phone: [phone] Subjective:   Kaitlyn Dominguez, am serving as a scribe for Dr. Antoine Primas.   I'm seeing this patient by the request  of:  Laurann Montana, MD  CC: Bilateral knee pain follow-up  HUT:[id number]  11/15/2021 Bilateral injections given today, tolerated the procedure well, patient would just like to feel somewhat better.  Patient has not been able to be as active with her having the pain recently.  Discussed which activities to do and which ones to avoid, increase activity slowly.  Follow-up again in 6 to 8 weeks. Could be a candidate again for viscosupplementation  Update 01/26/2022 Kaitlyn Dominguez is a 66 y.o. female coming in with complaint of B knee and L shoulder pain. Patient states that her shoulder is great. B knee pain persists. Pain in R>L especially with going up and down stairs. Injections did not help last visit.  Still unfortunately now has pain on a daily basis.  Can affect sleep even.     Past Medical History:  Diagnosis Date   Anxiety    History of femoral hernia repair 11/07/2021   Osteoarthritis    PONV (postoperative nausea and vomiting)    had emesis after surgery when went home   Serum calcium elevated    Vaginal atrophy    Past Surgical History:  Procedure Laterality Date   COLONOSCOPY     FOOT SURGERY     left; on multiple occasions   INGUINAL HERNIA REPAIR N/A 11/07/2021   Procedure: HERNIA REPAIR INGUINAL INCARCERATED;  Surgeon: Fritzi Mandes, MD;  Location: [facility] OR;  Service: General;  Laterality: N/A;   KNEE ARTHROSCOPY  2001, 2005   left x 2   LAPAROSCOPY N/A 11/07/2021   Procedure: LAPAROSCOPY DIAGNOSTIC;  Surgeon: Fritzi Mandes, MD;  Location: [facility] OR;  Service: General;  Laterality: N/A;   SHOULDER ARTHROSCOPY WITH ROTATOR CUFF REPAIR AND SUBACROMIAL DECOMPRESSION Right 12/01/2019   Procedure: SHOULDER ARTHROSCOPY WITH ROTATOR CUFF REPAIR, DISTAL  CLAVICLE EXCISION;  Surgeon: Jones Broom, MD;  Location: [facility];  Service: Orthopedics;  Laterality: Right;   SUBACROMIAL DECOMPRESSION Right 12/01/2019   Procedure: SUBACROMIAL DECOMPRESSION;  Surgeon: Jones Broom, MD;  Location: [facility];  Service: Orthopedics;  Laterality: Right;   Social History   Socioeconomic History   Marital status: Legally Separated    Spouse name: Not on file   Number of children: 3   Years of education: Not on file   Highest education level: Not on file  Occupational History    Employer: [employer]  Tobacco Use   Smoking status: Never   Smokeless tobacco: Never  Vaping Use   Vaping Use: Never used  Substance and Sexual Activity   Alcohol use: Yes    Alcohol/week: 3.0 standard drinks of alcohol    Types: 3 Glasses of wine per week    Comment: 2-3 drinks/week   Drug use: No   Sexual activity: Not on file  Other Topics Concern   Not on file  Social History Narrative   Not on file   Social Determinants of Health   Financial Resource Strain: Not on file  Food Insecurity: Not on file  Transportation Needs: Not on file  Physical Activity: Not on file  Stress: Not on file  Social Connections: Not on file   Allergies  Allergen Reactions   Penicillins     REACTION:  hives   Family History  Problem Relation Age of Onset   Heart attack Mother    Heart disease Maternal Grandmother    Alzheimer's disease Father    Colon cancer Neg Hx    Esophageal cancer Neg Hx    Rectal cancer Neg Hx    Stomach cancer Neg Hx        Current Outpatient Medications (Analgesics):    acetaminophen (TYLENOL) 500 MG tablet, Take 2 tablets (1,000 mg total) by mouth every 6 (six) hours. (Patient taking differently: Take 1,000 mg by mouth as needed.)   celecoxib (CELEBREX) 200 MG capsule, One to 2 tablets by mouth daily as needed for pain. (Patient taking differently: Take 100 mg by mouth daily.  One to 2 tablets by mouth daily as needed for pain.)   colchicine 0.6 MG tablet, Take 1 tablet (0.6 mg total) by mouth 2 (two) times daily.   oxyCODONE (OXY IR/ROXICODONE) 5 MG immediate release tablet, Take 1 tablet (5 mg total) by mouth every 6 (six) hours as needed for moderate pain, severe pain or breakthrough pain (pain not releived by tylenol, robaxin, heat/ice).   Current Outpatient Medications (Other):    cholecalciferol (VITAMIN D3) 25 MCG (1000 UNIT) tablet, Take 1,000 Units by mouth daily.   linaclotide (LINZESS) 290 MCG CAPS capsule, Take 1 capsule (290 mcg total) by mouth daily before breakfast.   LORazepam (ATIVAN) 0.5 MG tablet, Take 0.5 mg by mouth 2 (two) times daily as needed.   Magnesium Citrate 100 MG CAPS, Take 400 mg by mouth.   methocarbamol (ROBAXIN) 500 MG tablet, Take 1 tablet (500 mg total) by mouth every 6 (six) hours as needed for muscle spasms.   Omega-3 Fatty Acids (FISH OIL PO), Take 1 capsule by mouth daily.   polyethylene glycol (MIRALAX / GLYCOLAX) 17 g packet, Take 17 g by mouth daily.   traZODone (DESYREL) 100 MG tablet, Take 100 mg by mouth at bedtime.   venlafaxine (EFFEXOR) 37.5 MG tablet, Take 37.5 mg by mouth daily.   Reviewed prior external information including notes and imaging from  primary care provider As well as notes that were available from care everywhere and other healthcare systems.  Past medical history, social, surgical and family history all reviewed in electronic medical record.  No pertanent information unless stated regarding to the chief complaint.   Review of Systems:  No headache, visual changes, nausea, vomiting, diarrhea, constipation, dizziness, abdominal pain, skin rash, fevers, chills, night sweats, weight loss, swollen lymph nodes, body aches, joint swelling, chest pain, shortness of breath, mood changes. POSITIVE muscle aches  Objective  Blood pressure 102/70, pulse 72, height 5\' 7"  (1.702 m), weight 138 lb (62.6 kg),  SpO2 99 %.   General: No apparent distress alert and oriented x3 mood and affect normal, dressed appropriately.  HEENT: Pupils equal, extraocular movements intact  Respiratory: Patient's speak in full sentences and does not appear short of breath  Cardiovascular: No lower extremity edema, non tender, no erythema  Knee exam bilaterally does have significant arthritic changes.  Tender to palpation over the medial joint line.  Small effusion noted in the patellofemoral joint   After informed written and verbal consent, patient was seated on exam table. Right knee was prepped with alcohol swab and utilizing anterolateral approach, patient's right knee space was injected with15 mg/2.5 mL of Orthovisc(sodium hyaluronate) in a prefilled syringe was injected easily into the knee through a 22-gauge needle..Patient tolerated the procedure well without immediate complications.  After informed  written and verbal consent, patient was seated on exam table. Left knee was prepped with alcohol swab and utilizing anterolateral approach, patient's left knee space was injected with15 mg/2.5 mL of Orthovisc(sodium hyaluronate) in a prefilled syringe was injected easily into the knee through a 22-gauge needle..Patient tolerated the procedure well without immediate complications.    Impression and Recommendations:    The above documentation has been reviewed and is accurate and complete Judi SaaZachary M Onica Davidovich, DO

## 2022-01-26 ENCOUNTER — Encounter: Payer: Self-pay | Admitting: Family Medicine

## 2022-01-26 ENCOUNTER — Ambulatory Visit (INDEPENDENT_AMBULATORY_CARE_PROVIDER_SITE_OTHER): Payer: BC Managed Care – PPO | Admitting: Family Medicine

## 2022-01-26 DIAGNOSIS — M170 Bilateral primary osteoarthritis of knee: Secondary | ICD-10-CM | POA: Diagnosis not present

## 2022-01-26 NOTE — Assessment & Plan Note (Signed)
Chronic problem again.  Patient wants to avoid any surgical intervention.  Was started on viscosupplementation.  Patient knows of potential side effects.  Patient will come back in 1 week for second in a series of 4 injections.  Patient knows if any signs of inflammation to give a call or seek medical attention.

## 2022-01-26 NOTE — Progress Notes (Signed)
02/08/2022 Kaitlyn Dominguez [id number] [birth year]  Referring provider: Laurann Montana, MD Primary GI doctor: Dr. Myrtie Neither  ASSESSMENT AND PLAN:   Chronic idiopathic constipation -     CBC with Differential/Platelet; Future -     Comprehensive metabolic panel; Future -     TSH; Future -     Prucalopride Succinate (MOTEGRITY) 2 MG TABS; Take 1 tablet (2 mg total) by mouth daily. Has failed linzess, miralax, ducolax, fiber, squatty potty.  Will do trial of motegrity, discussed sitz markers versus anal rectal manometry. I think the patient has a combination of slow transit constipation and possible pelvic floor.  Likely worsened by emotional distress from recent son's passing.  Will check labs to assure normal thyroid, calcium, etc.   History of Present Illness:  66 y.o. female  with a past medical history of anxiety, elevated calcium, chronic constipation, hemorrhoids and others listed below, returns to clinic today for evaluation of constipation. 05/02/2018 colonoscopy excellent bowel prep with 2 days Suprep/MiraLAX diverticulosis, redundant colon recall 10 years.  Patient lost her son in April 2023, had incarcerated femoral hernia repair March 26, had added trazodone and Ativan and was on opioids for short-term for hernia repair. Given Linzess samples 290 and she can not see a difference, added on fiber and squatty potty. Mag citrate.  If not helping can consider sitz markers versus anal manometry, instructed also continue counseling likely component of IBS with recent loss of her son. She traveled to the beach recently and did not have a BM for several days, took dulcolax and had one episode of fecal incontinence.  She states she also has very hard pebbled symptoms.  She has had very small amount of weight loss.  She has some sweating at night, has raynaud's. Pulse is running high. She is trying to hydrate.    Current Medications:      Current Outpatient Medications (Analgesics):     acetaminophen (TYLENOL) 500 MG tablet, Take 2 tablets (1,000 mg total) by mouth every 6 (six) hours. (Patient taking differently: Take 1,000 mg by mouth as needed.)   celecoxib (CELEBREX) 200 MG capsule, One to 2 tablets by mouth daily as needed for pain. (Patient taking differently: Take 100 mg by mouth daily. One to 2 tablets by mouth daily as needed for pain.)   Current Outpatient Medications (Other):    cholecalciferol (VITAMIN D3) 25 MCG (1000 UNIT) tablet, Take 1,000 Units by mouth daily.   LORazepam (ATIVAN) 0.5 MG tablet, Take 0.5 mg by mouth 2 (two) times daily as needed.   Magnesium Citrate 100 MG CAPS, Take 750 mg by mouth.   Omega-3 Fatty Acids (FISH OIL PO), Take 1 capsule by mouth daily.   polyethylene glycol (MIRALAX / GLYCOLAX) 17 g packet, Take 17 g by mouth daily. (Patient taking differently: Take 17 g by mouth as needed.)   Prucalopride Succinate (MOTEGRITY) 2 MG TABS, Take 1 tablet (2 mg total) by mouth daily.   traZODone (DESYREL) 100 MG tablet, Take 100 mg by mouth at bedtime.   venlafaxine (EFFEXOR) 37.5 MG tablet, Take 37.5 mg by mouth daily.  Surgical History:  She  has a past surgical history that includes Foot surgery; Knee arthroscopy (2001, 2005); Colonoscopy; Shoulder arthroscopy with rotator cuff repair and subacromial decompression (Right, 12/01/2019); Subacromial decompression (Right, 12/01/2019); laparoscopy (N/A, 11/07/2021); and Inguinal hernia repair (N/A, 11/07/2021). Family History:  Her family history includes Alzheimer's disease in her father; Heart attack in her mother; Heart disease in her maternal  grandmother. Social History:   reports that she has never smoked. She has never used smokeless tobacco. She reports current alcohol use of about 3.0 standard drinks of alcohol per week. She reports that she does not use drugs.  Current Medications, Allergies, Past Medical History, Past Surgical History, Family History and Social History were reviewed in  [facility] record.  Physical Exam: BP 90/60   Pulse 75   Ht 5\' 7"  (1.702 m)   Wt 135 lb (61.2 kg)   BMI 21.14 kg/m  General:   Pleasant, well developed female in no acute distress Abdomen:   Soft, Flat AB, Sluggish bowel sounds. mild tenderness in the lower abdomen. Without guarding and Without rebound, No organomegaly appreciated. Rectal: Normal external rectal exam, normal rectal tone, some hard stools in rectal vault, negative hemoccult  Extremities:  Without edema. Msk: Symmetrical without gross deformities. Peripheral pulses intact.  Neurologic:  Alert and  oriented x4;  No focal deficits.  Skin:   Dry and intact without significant lesions or rashes. Psychiatric:  Cooperative. Normal mood and affect.   , PA-C 02/08/22

## 2022-01-26 NOTE — Patient Instructions (Addendum)
The first of 4 injections today HOKA Arahi Spenco Total Support Original

## 2022-01-31 NOTE — Progress Notes (Signed)
[name] [address] [place] [address] Phone: [phone] Subjective:   [name], am serving as a scribe for Dr. Antoine Primas.  I'm seeing this patient by the request  of:  Laurann Montana, MD  CC: Bilateral knee pain  JSR:[id number]  01/26/2022 Chronic problem again.  Patient wants to avoid any surgical intervention.  Was started on viscosupplementation.  Patient knows of potential side effects.  Patient will come back in 1 week for second in a series of 4 injections.  Patient knows if any signs of inflammation to give a call or seek medical attention.  Update 02/02/2022 Kaitlyn Dominguez is a 66 y.o. female coming in with complaint of B knee pain. Here for second Orthovisc injections. Patient states feels like its helping. Here for round 2 of gel injections.       Past Medical History:  Diagnosis Date   Anxiety    History of femoral hernia repair 11/07/2021   Osteoarthritis    PONV (postoperative nausea and vomiting)    had emesis after surgery when went home   Serum calcium elevated    Vaginal atrophy    Past Surgical History:  Procedure Laterality Date   COLONOSCOPY     FOOT SURGERY     left; on multiple occasions   INGUINAL HERNIA REPAIR N/A 11/07/2021   Procedure: HERNIA REPAIR INGUINAL INCARCERATED;  Surgeon: Fritzi Mandes, MD;  Location: [facility] OR;  Service: General;  Laterality: N/A;   KNEE ARTHROSCOPY  2001, 2005   left x 2   LAPAROSCOPY N/A 11/07/2021   Procedure: LAPAROSCOPY DIAGNOSTIC;  Surgeon: Fritzi Mandes, MD;  Location: [facility] OR;  Service: General;  Laterality: N/A;   SHOULDER ARTHROSCOPY WITH ROTATOR CUFF REPAIR AND SUBACROMIAL DECOMPRESSION Right 12/01/2019   Procedure: SHOULDER ARTHROSCOPY WITH ROTATOR CUFF REPAIR, DISTAL CLAVICLE EXCISION;  Surgeon: Jones Broom, MD;  Location:  [facility];  Service: Orthopedics;  Laterality: Right;   SUBACROMIAL DECOMPRESSION Right 12/01/2019   Procedure:  SUBACROMIAL DECOMPRESSION;  Surgeon: Jones Broom, MD;  Location:  SUR[facility]ERY CENTER;  Service: Orthopedics;  Laterality: Right;       Objective  Blood pressure 110/64, pulse 74, height 5\' 7"  (1.702 m), weight 139 lb (63 kg), SpO2 99 %.   General: No apparent distress alert and oriented x3 mood and affect normal, dressed appropriately.  HEENT: Pupils equal, extraocular movements intact  Respiratory: Patient's speak in full sentences and does not appear short of breath  Cardiovascular: No lower extremity edema, non tender, no erythema  Knees do have arthritic changes bilaterally.  Trace effusion noted.  No significant instability at the moment though.  After informed written and verbal consent, patient was seated on exam table. Right knee was prepped with alcohol swab and utilizing anterolateral approach, patient's right knee space was injected with15 mg/2.5 mL of Orthovisc(sodium hyaluronate) in a prefilled syringe was injected easily into the knee through a 22-gauge needle..Patient tolerated the procedure well without immediate complications.  After informed written and verbal consent, patient was seated on exam table. Left knee was prepped with alcohol swab and utilizing anterolateral approach, patient's left knee space was injected with15 mg/2.5 mL of Orthovisc(sodium hyaluronate) in a prefilled syringe was injected easily into the knee through a 22-gauge needle..Patient tolerated the procedure well without immediate complications.    Impression and Recommendations:     The above documentation has been reviewed and is accurate and complete , DO

## 2022-02-02 ENCOUNTER — Ambulatory Visit (INDEPENDENT_AMBULATORY_CARE_PROVIDER_SITE_OTHER): Payer: BC Managed Care – PPO | Admitting: Family Medicine

## 2022-02-02 DIAGNOSIS — M170 Bilateral primary osteoarthritis of knee: Secondary | ICD-10-CM

## 2022-02-02 NOTE — Assessment & Plan Note (Signed)
Second injection in the knees bilaterally in a series of 4 for viscosupplementation.  Follow-up in 1 week for her third in the series.  Patient warned of any type of side effects and when to seek medical attention.

## 2022-02-02 NOTE — Progress Notes (Signed)
  [name] [address] [place] [address] Phone: [phone] Subjective:   Kaitlyn Dominguez, am serving as a scribe for Dr. Antoine Primas.  I'm seeing this patient by the request  of:  Laurann Montana, MD  CC: patient will come at later date

## 2022-02-07 IMAGING — XA DG FLUORO GUIDE NDL PLC/BX
1 series · 1 of 1 positions shown · non-contrast
Comparison: none

CLINICAL DATA: Chronic right shoulder pain.

[Series 1: ortho standard · 1 of 1 slices shown]
[im 1/1]
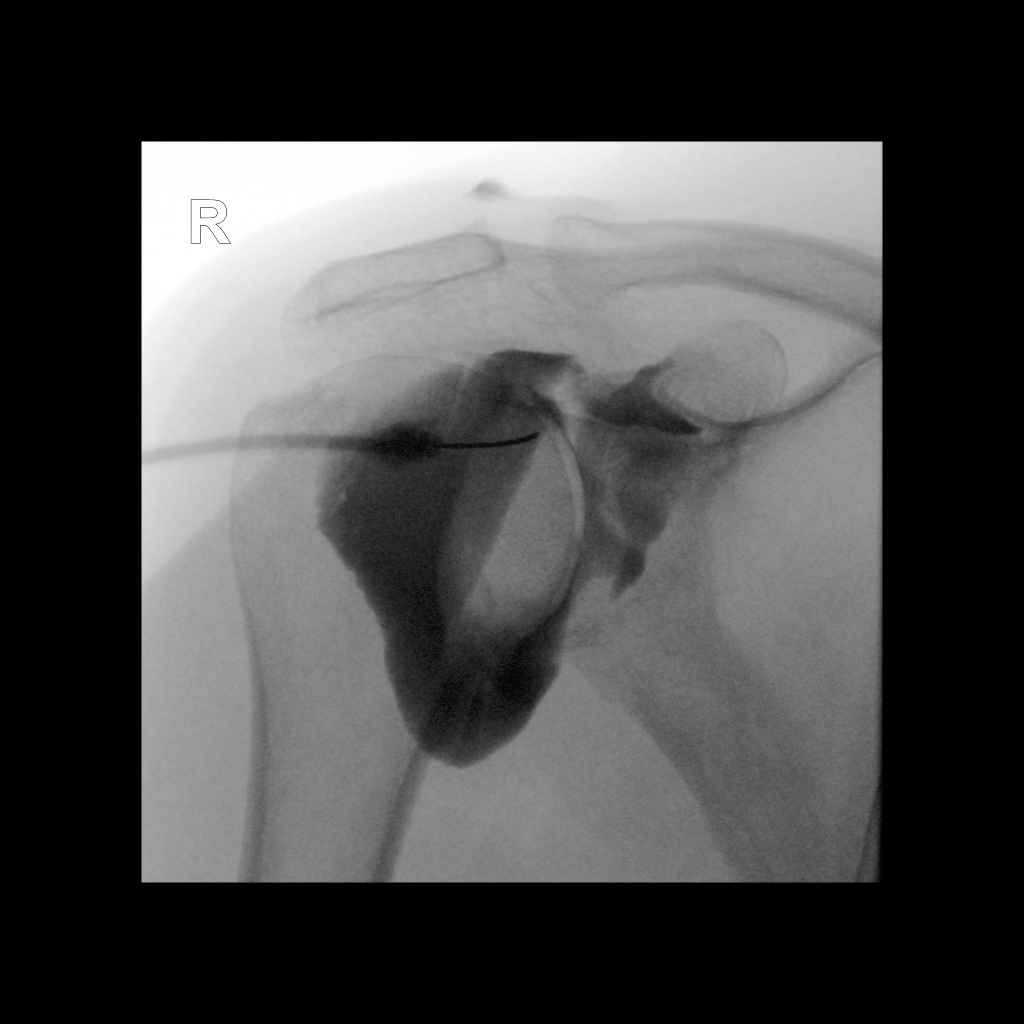

[1 of 1 positions shown; findings below may reference images not displayed]

FLUOROSCOPY TIME:  Radiation Exposure Index (as provided by the
fluoroscopic device): 0.3 mGy

Fluoroscopy Time:  6 seconds

Number of Acquired Images:  0

PROCEDURE:
The risks and benefits of the procedure were discussed with the
patient, and written informed consent was obtained. The patient
stated no history of allergy to contrast media. A formal timeout
procedure was performed with the patient according to departmental
protocol.

The patient was placed supine on the fluoroscopy table and the right
glenohumeral joint was identified under fluoroscopy. The skin
overlying the right glenohumeral joint was subsequently cleaned with
Betadine and a sterile drape was placed over the area of interest. 2
ml 1% Lidocaine was used to anesthetize the skin around the needle
insertion site.

A 22 gauge spinal needle was inserted into the right glenohumeral
joint under fluoroscopy.

12 ml of gadolinium mixture (0.1 ml of Multihance mixed with 10 ml
of Isovue-M 200 contrast and 10 ml of sterile saline) were injected
into the right glenohumeral joint.

The needle was removed and hemostasis was achieved. The patient was
subsequently transferred to MRI for imaging.
IMPRESSION: Technically successful right shoulder injection for MRI.

## 2022-02-08 ENCOUNTER — Other Ambulatory Visit (INDEPENDENT_AMBULATORY_CARE_PROVIDER_SITE_OTHER): Payer: BC Managed Care – PPO

## 2022-02-08 ENCOUNTER — Encounter: Payer: Self-pay | Admitting: Physician Assistant

## 2022-02-08 ENCOUNTER — Ambulatory Visit: Payer: BC Managed Care – PPO | Admitting: Family Medicine

## 2022-02-08 ENCOUNTER — Ambulatory Visit (INDEPENDENT_AMBULATORY_CARE_PROVIDER_SITE_OTHER): Payer: BC Managed Care – PPO | Admitting: Physician Assistant

## 2022-02-08 VITALS — BP 90/60 | HR 75 | Ht 67.0 in | Wt 135.0 lb

## 2022-02-08 DIAGNOSIS — K5904 Chronic idiopathic constipation: Secondary | ICD-10-CM | POA: Diagnosis not present

## 2022-02-08 LAB — CBC WITH DIFFERENTIAL/PLATELET
Basophils Absolute: 0.1 10*3/uL (ref 0.0–0.1)
Basophils Relative: 1.3 % (ref 0.0–3.0)
Eosinophils Absolute: 0.1 10*3/uL (ref 0.0–0.7)
Eosinophils Relative: 2.9 % (ref 0.0–5.0)
HCT: 38.9 % (ref 36.0–46.0)
Hemoglobin: 13.2 g/dL (ref 12.0–15.0)
Lymphocytes Relative: 28.8 % (ref 12.0–46.0)
Lymphs Abs: 1.2 10*3/uL (ref 0.7–4.0)
MCHC: 34 g/dL (ref 30.0–36.0)
MCV: 86.3 fl (ref 78.0–100.0)
Monocytes Absolute: 0.5 10*3/uL (ref 0.1–1.0)
Monocytes Relative: 13.4 % — ABNORMAL HIGH (ref 3.0–12.0)
Neutro Abs: 2.2 10*3/uL (ref 1.4–7.7)
Neutrophils Relative %: 53.6 % (ref 43.0–77.0)
Platelets: 297 10*3/uL (ref 150.0–400.0)
RBC: 4.5 Mil/uL (ref 3.87–5.11)
RDW: 12.8 % (ref 11.5–15.5)
WBC: 4 10*3/uL (ref 4.0–10.5)

## 2022-02-08 LAB — COMPREHENSIVE METABOLIC PANEL
ALT: 13 U/L (ref 0–35)
AST: 19 U/L (ref 0–37)
Albumin: 4.7 g/dL (ref 3.5–5.2)
Alkaline Phosphatase: 81 U/L (ref 39–117)
BUN: 10 mg/dL (ref 6–23)
CO2: 28 mEq/L (ref 19–32)
Calcium: 11.3 mg/dL — ABNORMAL HIGH (ref 8.4–10.5)
Chloride: 105 mEq/L (ref 96–112)
Creatinine, Ser: 0.69 mg/dL (ref 0.40–1.20)
GFR: 91.75 mL/min (ref >60.00)
Glucose, Bld: 72 mg/dL (ref 70–99)
Potassium: 4.6 mEq/L (ref 3.5–5.1)
Sodium: 139 mEq/L (ref 135–145)
Total Bilirubin: 0.4 mg/dL (ref 0.2–1.2)
Total Protein: 7.1 g/dL (ref 6.0–8.3)

## 2022-02-08 LAB — TSH: TSH: 2.94 u[IU]/mL (ref 0.35–5.50)

## 2022-02-08 MED ORDER — MOTEGRITY 2 MG PO TABS: 2.0000 mg | ORAL_TABLET | Freq: Every day | ORAL | 0 refills | Status: AC

## 2022-02-08 NOTE — Patient Instructions (Addendum)
Your provider has requested that you go to the basement level for lab work before leaving today. Press "B" on the elevator. The lab is located at the first door on the left as you exit the elevator.   Miralax is an osmotic laxative.  It only brings more water into the stool.  This is safe to take daily.  Can take up to 17 gram of miralax twice a day.  Mix with juice or coffee.  Start 1 capful at night for 3-4 days and reassess your response in 3-4 days.  You can increase and decrease the dose based on your response.  Remember, it can take up to 3-4 days to take effect OR for the effects to wear off.   I often pair this with benefiber in the morning to help assure the stool is not too loose.   Can do linzess with miralax if need to.   Can consider anorectal manometry or sitz markers

## 2022-02-09 NOTE — Progress Notes (Signed)
[name] [address] [place] [address] Phone: [phone] Subjective:   [name], am serving as a scribe for Dr. Antoine Primas.  I'm seeing this patient by the request  of:  Laurann Montana, MD  CC: bilateral knee pain   [id number]  Kaitlyn Dominguez is a 66 y.o. female coming in with complaint of B knee pain. Here for Orthovisc #3. Patient states that she feels she is improving. Has not been active to test it.        Past Medical History:  Diagnosis Date   Anxiety    History of femoral hernia repair 11/07/2021   Osteoarthritis    PONV (postoperative nausea and vomiting)    had emesis after surgery when went home   Serum calcium elevated    Vaginal atrophy    Past Surgical History:  Procedure Laterality Date   COLONOSCOPY     FOOT SURGERY     left; on multiple occasions   INGUINAL HERNIA REPAIR N/A 11/07/2021   Procedure: HERNIA REPAIR INGUINAL INCARCERATED;  Surgeon: Fritzi Mandes, MD;  Location: [facility] OR;  Service: General;  Laterality: N/A;   KNEE ARTHROSCOPY  2001, 2005   left x 2   LAPAROSCOPY N/A 11/07/2021   Procedure: LAPAROSCOPY DIAGNOSTIC;  Surgeon: Fritzi Mandes, MD;  Location: [facility] OR;  Service: General;  Laterality: N/A;   SHOULDER ARTHROSCOPY WITH ROTATOR CUFF REPAIR AND SUBACROMIAL DECOMPRESSION Right 12/01/2019   Procedure: SHOULDER ARTHROSCOPY WITH ROTATOR CUFF REPAIR, DISTAL CLAVICLE EXCISION;  Surgeon: Jones Broom, MD;  Location: [facility];  Service: Orthopedics;  Laterality: Right;   SUBACROMIAL DECOMPRESSION Right 12/01/2019   Procedure: SUBACROMIAL DECOMPRESSION;  Surgeon: Jones Broom, MD;  Location: [facility];  Service: Orthopedics;  Laterality: Right;   Social History   Socioeconomic History   Marital status: Legally Separated    Spouse name: Not on file   Number of children: 3   Years of education: Not on file   Highest education level: Not on file   Occupational History    Employer: [employer]  Tobacco Use   Smoking status: Never   Smokeless tobacco: Never  Vaping Use   Vaping Use: Never used  Substance and Sexual Activity   Alcohol use: Yes    Alcohol/week: 3.0 standard drinks of alcohol    Types: 3 Glasses of wine per week    Comment: 2-3 drinks/week   Drug use: No   Sexual activity: Not on file  Other Topics Concern   Not on file  Social History Narrative   Not on file   Social Determinants of Health   Financial Resource Strain: Not on file  Food Insecurity: Not on file  Transportation Needs: Not on file  Physical Activity: Not on file  Stress: Not on file  Social Connections: Not on file   Allergies  Allergen Reactions   Penicillins     REACTION: hives   Family History  Problem Relation Age of Onset   Heart attack Mother    Heart disease Maternal Grandmother    Alzheimer's disease Father    Colon cancer Neg Hx    Esophageal cancer Neg Hx    Rectal cancer Neg Hx    Stomach cancer Neg Hx        Current Outpatient Medications (Analgesics):    acetaminophen (TYLENOL) 500 MG tablet, Take 2 tablets (1,000 mg total) by mouth every 6 (six) hours. (Patient  taking differently: Take 1,000 mg by mouth as needed.)   celecoxib (CELEBREX) 200 MG capsule, One to 2 tablets by mouth daily as needed for pain. (Patient taking differently: Take 100 mg by mouth daily. One to 2 tablets by mouth daily as needed for pain.)   Current Outpatient Medications (Other):    cholecalciferol (VITAMIN D3) 25 MCG (1000 UNIT) tablet, Take 1,000 Units by mouth daily.   LORazepam (ATIVAN) 0.5 MG tablet, Take 0.5 mg by mouth 2 (two) times daily as needed.   Magnesium Citrate 100 MG CAPS, Take 750 mg by mouth.   Omega-3 Fatty Acids (FISH OIL PO), Take 1 capsule by mouth daily.   polyethylene glycol (MIRALAX / GLYCOLAX) 17 g packet, Take 17 g by mouth daily. (Patient taking differently: Take 17 g by mouth as  needed.)   Prucalopride Succinate (MOTEGRITY) 2 MG TABS, Take 1 tablet (2 mg total) by mouth daily.   traZODone (DESYREL) 100 MG tablet, Take 100 mg by mouth at bedtime.   venlafaxine (EFFEXOR) 37.5 MG tablet, Take 37.5 mg by mouth daily.     Objective  Blood pressure 120/84, pulse 76, height 5\' 7"  (1.702 m), weight 138 lb (62.6 kg), SpO2 99 %.   General: No apparent distress alert and oriented x3 mood and affect normal, dressed   After informed written and verbal consent, patient was seated on exam table. Right knee was prepped with alcohol swab and utilizing anterolateral approach, patient's right knee space was injected with15 mg/2.5 mL of Orthovisc(sodium hyaluronate) in a prefilled syringe was injected easily into the knee through a 22-gauge needle..Patient tolerated the procedure well without immediate complications.  After informed written and verbal consent, patient was seated on exam table. Left knee was prepped with alcohol swab and utilizing anterolateral approach, patient's left knee space was injected with15 mg/2.5 mL of Orthovisc(sodium hyaluronate) in a prefilled syringe was injected easily into the knee through a 22-gauge needle..Patient tolerated the procedure well without immediate complications.   Impression and Recommendations:    The above documentation has been reviewed and is accurate and complete , DO

## 2022-02-10 ENCOUNTER — Telehealth: Payer: Self-pay | Admitting: Physician Assistant

## 2022-02-10 ENCOUNTER — Ambulatory Visit (INDEPENDENT_AMBULATORY_CARE_PROVIDER_SITE_OTHER): Payer: BC Managed Care – PPO | Admitting: Family Medicine

## 2022-02-10 DIAGNOSIS — M170 Bilateral primary osteoarthritis of knee: Secondary | ICD-10-CM

## 2022-02-10 NOTE — Assessment & Plan Note (Signed)
Third in a series of 4 injections given today, tolerated the procedure well, discussed coming back in 2 weeks for the fourth and final injection

## 2022-02-10 NOTE — Telephone Encounter (Signed)
Inbound call from patient stating the medication Montegrity is working very well and she thanks you.

## 2022-02-15 NOTE — Progress Notes (Signed)
____________________________________________________________  Attending physician addendum:  Thank you for sending this case to me. I have reviewed the entire note and agree with the plan.  Redundant colon anatomy seen on 2019 colonoscopy is also contributing to her constipation.  Amada Jupiter, MD  ____________________________________________________________

## 2022-02-21 NOTE — Progress Notes (Unsigned)
[name] [address] [place] [address] Phone: [phone] Subjective:   [name], am serving as a scribe for Dr. Antoine Primas.   I'm seeing this patient by the request  of:  Laurann Montana, MD  CC: Bilateral knee pain  [id number]  Kaitlyn Dominguez is a 66 y.o. female coming in with complaint of B knee pain. Is here for final Orthovisc injections. Patient states that she is not feeling like injections are helping her R knee. Pain with sit to stand. Unable to do Warrior 2 pose as knee feels unstable. Does noticed some improvement in L knee.        Past Medical History:  Diagnosis Date   Anxiety    History of femoral hernia repair 11/07/2021   Osteoarthritis    PONV (postoperative nausea and vomiting)    had emesis after surgery when went home   Serum calcium elevated    Vaginal atrophy    Past Surgical History:  Procedure Laterality Date   COLONOSCOPY     FOOT SURGERY     left; on multiple occasions   INGUINAL HERNIA REPAIR N/A 11/07/2021   Procedure: HERNIA REPAIR INGUINAL INCARCERATED;  Surgeon: Fritzi Mandes, MD;  Location: [facility] OR;  Service: General;  Laterality: N/A;   KNEE ARTHROSCOPY  2001, 2005   left x 2   LAPAROSCOPY N/A 11/07/2021   Procedure: LAPAROSCOPY DIAGNOSTIC;  Surgeon: Fritzi Mandes, MD;  Location: [facility] OR;  Service: General;  Laterality: N/A;   SHOULDER ARTHROSCOPY WITH ROTATOR CUFF REPAIR AND SUBACROMIAL DECOMPRESSION Right 12/01/2019   Procedure: SHOULDER ARTHROSCOPY WITH ROTATOR CUFF REPAIR, DISTAL CLAVICLE EXCISION;  Surgeon: Jones Broom, MD;  Location: [facility];  Service: Orthopedics;  Laterality: Right;   SUBACROMIAL DECOMPRESSION Right 12/01/2019   Procedure: SUBACROMIAL DECOMPRESSION;  Surgeon: Jones Broom, MD;  Location: [facility];  Service: Orthopedics;  Laterality: Right;   Social History   Socioeconomic History   Marital status: Legally Separated     Spouse name: Not on file   Number of children: 3   Years of education: Not on file   Highest education level: Not on file  Occupational History    Employer: [employer]  Tobacco Use   Smoking status: Never   Smokeless tobacco: Never  Vaping Use   Vaping Use: Never used  Substance and Sexual Activity   Alcohol use: Yes    Alcohol/week: 3.0 standard drinks of alcohol    Types: 3 Glasses of wine per week    Comment: 2-3 drinks/week   Drug use: No   Sexual activity: Not on file  Other Topics Concern   Not on file  Social History Narrative   Not on file   Social Determinants of Health   Financial Resource Strain: Not on file  Food Insecurity: Not on file  Transportation Needs: Not on file  Physical Activity: Not on file  Stress: Not on file  Social Connections: Not on file   Allergies  Allergen Reactions   Penicillins     REACTION: hives   Family History  Problem Relation Age of Onset   Heart attack Mother    Heart disease Maternal Grandmother    Alzheimer's disease Father    Colon cancer Neg Hx    Esophageal cancer Neg Hx    Rectal cancer Neg Hx    Stomach cancer Neg Hx        Current Outpatient  Medications (Analgesics):    acetaminophen (TYLENOL) 500 MG tablet, Take 2 tablets (1,000 mg total) by mouth every 6 (six) hours. (Patient taking differently: Take 1,000 mg by mouth as needed.)   celecoxib (CELEBREX) 200 MG capsule, One to 2 tablets by mouth daily as needed for pain. (Patient taking differently: Take 100 mg by mouth daily. One to 2 tablets by mouth daily as needed for pain.)   Current Outpatient Medications (Other):    cholecalciferol (VITAMIN D3) 25 MCG (1000 UNIT) tablet, Take 1,000 Units by mouth daily.   LORazepam (ATIVAN) 0.5 MG tablet, Take 0.5 mg by mouth 2 (two) times daily as needed.   Magnesium Citrate 100 MG CAPS, Take 750 mg by mouth.   Omega-3 Fatty Acids (FISH OIL PO), Take 1 capsule by mouth daily.    polyethylene glycol (MIRALAX / GLYCOLAX) 17 g packet, Take 17 g by mouth daily. (Patient taking differently: Take 17 g by mouth as needed.)   Prucalopride Succinate (MOTEGRITY) 2 MG TABS, Take 1 tablet (2 mg total) by mouth daily.   traZODone (DESYREL) 100 MG tablet, Take 100 mg by mouth at bedtime.   venlafaxine (EFFEXOR) 37.5 MG tablet, Take 37.5 mg by mouth daily.  Objective  Blood pressure 110/82, pulse 61, height 5\' 7"  (1.702 m), weight 134 lb (60.8 kg), SpO2 99 %.   General: No apparent distress alert and oriented x3 mood and affect normal, dressed appropriately.   After informed written and verbal consent, patient was seated on exam table. Right knee was prepped with alcohol swab and utilizing anterolateral approach, patient's right knee space was injected with15 mg/2.5 mL of Orthovisc(sodium hyaluronate) in a prefilled syringe was injected easily into the knee through a 22-gauge needle..Patient tolerated the procedure well without immediate complications.  After informed written and verbal consent, patient was seated on exam table. Left knee was prepped with alcohol swab and utilizing anterolateral approach, patient's left knee space was injected with15 mg/2.5 mL of Orthovisc(sodium hyaluronate) in a prefilled syringe was injected easily into the knee through a 22-gauge needle..Patient tolerated the procedure well without immediate complications.   Impression and Recommendations:     The above documentation has been reviewed and is accurate and complete , DO

## 2022-02-23 ENCOUNTER — Ambulatory Visit (INDEPENDENT_AMBULATORY_CARE_PROVIDER_SITE_OTHER): Payer: BC Managed Care – PPO | Admitting: Family Medicine

## 2022-02-23 DIAGNOSIS — M170 Bilateral primary osteoarthritis of knee: Secondary | ICD-10-CM | POA: Diagnosis not present

## 2022-02-23 MED ORDER — HYALURONAN 30 MG/2ML IX SOSY
30.0000 mg | PREFILLED_SYRINGE | Freq: Once | INTRA_ARTICULAR | Status: AC
  Administered 2022-02-23: 30 mg via INTRA_ARTICULAR

## 2022-02-23 NOTE — Patient Instructions (Signed)
Congrats you have made it! Should continue to improve over the next month and then hope to get 6-12 months of improvement with these injections Have another appointment set up in 8 weeks just in case but if doing well can see me when you need me

## 2022-02-23 NOTE — Assessment & Plan Note (Signed)
For the final injection for viscosupplementation today.  Patient tolerated the procedure well.  Patient knows that this hopefully will make significant improvements.  Patient did get greater than 6 months with her previous injections.  The patient is to increase activity as tolerated, come back with any other significant worsening discomfort or pain.  Follow-up with me again in 6 to 8 weeks

## 2022-02-27 ENCOUNTER — Other Ambulatory Visit (HOSPITAL_COMMUNITY): Payer: Self-pay

## 2022-02-27 ENCOUNTER — Telehealth: Payer: Self-pay | Admitting: Pharmacy Technician

## 2022-02-27 NOTE — Telephone Encounter (Signed)
Patient Advocate Encounter  Received notification from [facility] OFFICE that prior authorization for MOTEGRITY 2MG  is required.   PA submitted on 7.17.23 Key [id number] Status is pending    07-13-1982, CPhT Patient Advocate Phone: [phone]

## 2022-03-02 NOTE — Telephone Encounter (Signed)
Patient Advocate Encounter  Prior Authorization for Motegrity 2MG  has been approved.    Effective dates: 02/27/2022 through 02/26/2023  02/28/2023, CPhT Pharmacy Patient Advocate Specialist [facility] Patient Advocate Team Phone: [phone]   Fax: [phone]

## 2022-04-11 ENCOUNTER — Ambulatory Visit: Payer: BC Managed Care – PPO | Admitting: Family Medicine

## 2022-04-15 ENCOUNTER — Encounter: Payer: Self-pay | Admitting: Family Medicine

## 2022-04-18 ENCOUNTER — Other Ambulatory Visit: Payer: Self-pay

## 2022-04-18 MED ORDER — CELECOXIB 200 MG PO CAPS: ORAL_CAPSULE | ORAL | 1 refills | Status: DC

## 2022-04-18 NOTE — Telephone Encounter (Signed)
Medication refilled

## 2022-05-05 NOTE — Progress Notes (Unsigned)
[facility] [address] [place] [address] Phone: [phone] Subjective:   [name], am serving as a scribe for Dr. Antoine Primas.  I'm seeing this patient by the request  of:  Laurann Montana, MD  CC: Bilateral knee pain  [id number]  02/23/2022 For the final injection for viscosupplementation today.  Patient tolerated the procedure well.  Patient knows that this hopefully will make significant improvements.  Patient did get greater than 6 months with her previous injections.  The patient is to increase activity as tolerated, come back with any other significant worsening discomfort or pain.  Follow-up with me again in 6 to 8 weeks  Update 05/09/2022 Kaitlyn Dominguez is a 66 y.o. female coming in with complaint of B knee pain. Patient states that she did go to [place] and hiked using poles. Was limited in hiking due to knee pain. Increase in pain over lateral aspect of R knee. L knee is also more painful surrounding patella. Also notes swelling in R ankle.       Past Medical History:  Diagnosis Date   Anxiety    History of femoral hernia repair 11/07/2021   Osteoarthritis    PONV (postoperative nausea and vomiting)    had emesis after surgery when went home   Serum calcium elevated    Vaginal atrophy    Past Surgical History:  Procedure Laterality Date   COLONOSCOPY     FOOT SURGERY     left; on multiple occasions   INGUINAL HERNIA REPAIR N/A 11/07/2021   Procedure: HERNIA REPAIR INGUINAL INCARCERATED;  Surgeon: Fritzi Mandes, MD;  Location: [facility] OR;  Service: General;  Laterality: N/A;   KNEE ARTHROSCOPY  2001, 2005   left x 2   LAPAROSCOPY N/A 11/07/2021   Procedure: LAPAROSCOPY DIAGNOSTIC;  Surgeon: Fritzi Mandes, MD;  Location: [facility] OR;  Service: General;  Laterality: N/A;   SHOULDER ARTHROSCOPY WITH ROTATOR CUFF REPAIR AND SUBACROMIAL DECOMPRESSION Right 12/01/2019   Procedure: SHOULDER ARTHROSCOPY WITH ROTATOR CUFF REPAIR,  DISTAL CLAVICLE EXCISION;  Surgeon: Jones Broom, MD;  Location: [facility];  Service: Orthopedics;  Laterality: Right;   SUBACROMIAL DECOMPRESSION Right 12/01/2019   Procedure: SUBACROMIAL DECOMPRESSION;  Surgeon: Jones Broom, MD;  Location: [facility];  Service: Orthopedics;  Laterality: Right;   Social History   Socioeconomic History   Marital status: Legally Separated    Spouse name: Not on file   Number of children: 3   Years of education: Not on file   Highest education level: Not on file  Occupational History    Employer: [employer] [place]  Tobacco Use   Smoking status: Never   Smokeless tobacco: Never  Vaping Use   Vaping Use: Never used  Substance and Sexual Activity   Alcohol use: Yes    Alcohol/week: 3.0 standard drinks of alcohol    Types: 3 Glasses of wine per week    Comment: 2-3 drinks/week   Drug use: No   Sexual activity: Not on file  Other Topics Concern   Not on file  Social History Narrative   Not on file   Social Determinants of Health   Financial Resource Strain: Not on file  Food Insecurity: Not on file  Transportation Needs: Not on file  Physical Activity: Not on file  Stress: Not on file  Social Connections: Not on file   Allergies  Allergen Reactions   Penicillins  REACTION: hives   Family History  Problem Relation Age of Onset   Heart attack Mother    Heart disease Maternal Grandmother    Alzheimer's disease Father    Colon cancer Neg Hx    Esophageal cancer Neg Hx    Rectal cancer Neg Hx    Stomach cancer Neg Hx        Current Outpatient Medications (Analgesics):    acetaminophen (TYLENOL) 500 MG tablet, Take 2 tablets (1,000 mg total) by mouth every 6 (six) hours. (Patient taking differently: Take 1,000 mg by mouth as needed.)   celecoxib (CELEBREX) 200 MG capsule, One to 2 tablets by mouth daily as needed for pain.   Current Outpatient Medications (Other):     cholecalciferol (VITAMIN D3) 25 MCG (1000 UNIT) tablet, Take 1,000 Units by mouth daily.   LORazepam (ATIVAN) 0.5 MG tablet, Take 0.5 mg by mouth 2 (two) times daily as needed.   Magnesium Citrate 100 MG CAPS, Take 750 mg by mouth.   Omega-3 Fatty Acids (FISH OIL PO), Take 1 capsule by mouth daily.   polyethylene glycol (MIRALAX / GLYCOLAX) 17 g packet, Take 17 g by mouth daily. (Patient taking differently: Take 17 g by mouth as needed.)   Prucalopride Succinate (MOTEGRITY) 2 MG TABS, Take 1 tablet (2 mg total) by mouth daily.   traZODone (DESYREL) 100 MG tablet, Take 100 mg by mouth at bedtime.   venlafaxine (EFFEXOR) 37.5 MG tablet, Take 37.5 mg by mouth daily.   Reviewed prior external information including notes and imaging from  primary care provider As well as notes that were available from care everywhere and other healthcare systems.  Past medical history, social, surgical and family history all reviewed in electronic medical record.  No pertanent information unless stated regarding to the chief complaint.   Review of Systems:  No headache, visual changes, nausea, vomiting, diarrhea, constipation, dizziness, abdominal pain, skin rash, fevers, chills, night sweats, weight loss, swollen lymph nodes, body aches, joint swelling, chest pain, shortness of breath, mood changes. POSITIVE muscle aches  Objective  Blood pressure 112/82, pulse 68, height 5\' 7"  (1.702 m), weight 138 lb (62.6 kg), SpO2 99 %.   General: No apparent distress alert and oriented x3 mood and affect normal, dressed appropriately.  HEENT: Pupils equal, extraocular movements intact  Respiratory: Patient's speak in full sentences and does not appear short of breath  Cardiovascular: No lower extremity edema, non tender, no erythema  Bilateral knees do have crepitus noted bilaterally.  Trace effusion noted bilaterally.  Does have lateral tracking of the patella.  After informed written and verbal consent, patient was  seated on exam table. Right knee was prepped with alcohol swab and utilizing anterolateral approach, patient's right knee space was injected with 4:1  marcaine 0.5%: Kenalog 40mg /dL. Patient tolerated the procedure well without immediate complications.  After informed written and verbal consent, patient was seated on exam table. Left knee was prepped with alcohol swab and utilizing anterolateral approach, patient's left knee space was injected with 4:1  marcaine 0.5%: Kenalog 40mg /dL. Patient tolerated the procedure well without immediate complications.    Impression and Recommendations:     The above documentation has been reviewed and is accurate and complete Kaitlyn Pulley, DO

## 2022-05-09 ENCOUNTER — Ambulatory Visit (INDEPENDENT_AMBULATORY_CARE_PROVIDER_SITE_OTHER): Payer: BC Managed Care – PPO

## 2022-05-09 ENCOUNTER — Other Ambulatory Visit: Payer: Self-pay | Admitting: Family Medicine

## 2022-05-09 ENCOUNTER — Encounter: Payer: Self-pay | Admitting: Family Medicine

## 2022-05-09 ENCOUNTER — Ambulatory Visit (INDEPENDENT_AMBULATORY_CARE_PROVIDER_SITE_OTHER): Payer: BC Managed Care – PPO | Admitting: Family Medicine

## 2022-05-09 VITALS — BP 112/82 | HR 68 | Ht 67.0 in | Wt 138.0 lb

## 2022-05-09 DIAGNOSIS — M170 Bilateral primary osteoarthritis of knee: Secondary | ICD-10-CM

## 2022-05-09 DIAGNOSIS — G8929 Other chronic pain: Secondary | ICD-10-CM

## 2022-05-09 DIAGNOSIS — M25561 Pain in right knee: Secondary | ICD-10-CM

## 2022-05-09 DIAGNOSIS — M1711 Unilateral primary osteoarthritis, right knee: Secondary | ICD-10-CM | POA: Diagnosis not present

## 2022-05-09 DIAGNOSIS — M1712 Unilateral primary osteoarthritis, left knee: Secondary | ICD-10-CM | POA: Diagnosis not present

## 2022-05-09 NOTE — Patient Instructions (Signed)
Good to see you as always  Injected the knees again today  Ice 20 minutes 2 times daily. Usually after activity and before bed. Exercises 3 times a week.  Xrays on the way out  See me again in 10-12 weeks

## 2022-05-09 NOTE — Assessment & Plan Note (Signed)
Bilateral injections given today.  New x-rays ordered today to further evaluate the progression of the arthritis.  Discussed icing regimen and home exercises.  Follow-up again in 10 to 12 weeks

## 2022-06-08 ENCOUNTER — Telehealth: Payer: Self-pay | Admitting: Physician Assistant

## 2022-06-08 NOTE — Telephone Encounter (Signed)
Patient called requesting a refill on Motegrity states the pharmacy tried to reach the office but no response received back.Marland Kitchen

## 2022-06-09 ENCOUNTER — Other Ambulatory Visit: Payer: Self-pay

## 2022-06-09 MED ORDER — MOTEGRITY 2 MG PO TABS: 2.0000 mg | ORAL_TABLET | Freq: Every day | ORAL | 0 refills | Status: DC

## 2022-06-09 NOTE — Telephone Encounter (Signed)
Refill sent to pharmacy. Left detailed message for patient & advised her to call back with any questions.

## 2022-06-12 ENCOUNTER — Encounter: Payer: Self-pay | Admitting: Family Medicine

## 2022-07-03 DIAGNOSIS — F331 Major depressive disorder, recurrent, moderate: Secondary | ICD-10-CM | POA: Diagnosis not present

## 2022-07-03 DIAGNOSIS — F411 Generalized anxiety disorder: Secondary | ICD-10-CM | POA: Diagnosis not present

## 2022-07-25 NOTE — Progress Notes (Unsigned)
[name] [address] [place] [address] Phone: [phone] Subjective:   [name], am serving as a scribe for Dr. Antoine Primas.  I'm seeing this patient by the request  of:  Laurann Montana, MD  CC: Bilateral knee pain follow-up  [id number]  05/09/2022 Bilateral injections given today. New x-rays ordered today to further evaluate the progression of the arthritis. Discussed icing regimen and home exercises. Follow-up again in 10 to 12 weeks   07/27/2022 Kaitlyn Dominguez is a 66 y.o. female coming in with complaint of R knee pain. Patient states that she is doing well. Using Celebrex daily. Also taking magneisum glycinate. Knee not giving out with yoga.       Past Medical History:  Diagnosis Date   Anxiety    History of femoral hernia repair 11/07/2021   Osteoarthritis    PONV (postoperative nausea and vomiting)    had emesis after surgery when went home   Serum calcium elevated    Vaginal atrophy    Past Surgical History:  Procedure Laterality Date   COLONOSCOPY     FOOT SURGERY     left; on multiple occasions   INGUINAL HERNIA REPAIR N/A 11/07/2021   Procedure: HERNIA REPAIR INGUINAL INCARCERATED;  Surgeon: Fritzi Mandes, MD;  Location: [facility] OR;  Service: General;  Laterality: N/A;   KNEE ARTHROSCOPY  2001, 2005   left x 2   LAPAROSCOPY N/A 11/07/2021   Procedure: LAPAROSCOPY DIAGNOSTIC;  Surgeon: Fritzi Mandes, MD;  Location: [facility] OR;  Service: General;  Laterality: N/A;   SHOULDER ARTHROSCOPY WITH ROTATOR CUFF REPAIR AND SUBACROMIAL DECOMPRESSION Right 12/01/2019   Procedure: SHOULDER ARTHROSCOPY WITH ROTATOR CUFF REPAIR, DISTAL CLAVICLE EXCISION;  Surgeon: Jones Broom, MD;  Location: [facility];  Service: Orthopedics;  Laterality: Right;   SUBACROMIAL DECOMPRESSION Right 12/01/2019   Procedure: SUBACROMIAL DECOMPRESSION;  Surgeon: Jones Broom, MD;  Location: [facility];  Service:  Orthopedics;  Laterality: Right;   Social History   Socioeconomic History   Marital status: Legally Separated    Spouse name: Not on file   Number of children: 3   Years of education: Not on file   Highest education level: Not on file  Occupational History    Employer: [employer]  Tobacco Use   Smoking status: Never   Smokeless tobacco: Never  Vaping Use   Vaping Use: Never used  Substance and Sexual Activity   Alcohol use: Yes    Alcohol/week: 3.0 standard drinks of alcohol    Types: 3 Glasses of wine per week    Comment: 2-3 drinks/week   Drug use: No   Sexual activity: Not on file  Other Topics Concern   Not on file  Social History Narrative   Not on file   Social Determinants of Health   Financial Resource Strain: Not on file  Food Insecurity: Not on file  Transportation Needs: Not on file  Physical Activity: Not on file  Stress: Not on file  Social Connections: Not on file   Allergies  Allergen Reactions   Penicillins     REACTION: hives   Family History  Problem Relation Age of Onset   Heart attack Mother    Heart disease Maternal Grandmother    Alzheimer's disease Father    Colon cancer Neg Hx    Esophageal cancer Neg Hx    Rectal cancer Neg Hx    Stomach cancer Neg  Hx        Current Outpatient Medications (Analgesics):    acetaminophen (TYLENOL) 500 MG tablet, Take 2 tablets (1,000 mg total) by mouth every 6 (six) hours. (Patient taking differently: Take 1,000 mg by mouth as needed.)   celecoxib (CELEBREX) 200 MG capsule, One to 2 tablets by mouth daily as needed for pain.   Current Outpatient Medications (Other):    cholecalciferol (VITAMIN D3) 25 MCG (1000 UNIT) tablet, Take 1,000 Units by mouth daily.   LORazepam (ATIVAN) 0.5 MG tablet, Take 0.5 mg by mouth 2 (two) times daily as needed.   Magnesium Citrate 100 MG CAPS, Take 750 mg by mouth.   Omega-3 Fatty Acids (FISH OIL PO), Take 1 capsule by mouth daily.    polyethylene glycol (MIRALAX / GLYCOLAX) 17 g packet, Take 17 g by mouth daily. (Patient taking differently: Take 17 g by mouth as needed.)   Prucalopride Succinate (MOTEGRITY) 2 MG TABS, Take 1 tablet (2 mg total) by mouth daily.   traZODone (DESYREL) 100 MG tablet, Take 100 mg by mouth at bedtime.   venlafaxine (EFFEXOR) 37.5 MG tablet, Take 37.5 mg by mouth daily.     Review of Systems:  No headache, visual changes, nausea, vomiting, diarrhea, constipation, dizziness, abdominal pain, skin rash, fevers, chills, night sweats, weight loss, swollen lymph nodes, body aches, joint swelling, chest pain, shortness of breath, mood changes.   Objective  Blood pressure 108/68, pulse 64, height 5\' 7"  (1.702 m), weight 138 lb (62.6 kg), SpO2 98 %.   General: No apparent distress alert and oriented x3 mood and affect normal, dressed appropriately.  HEENT: Pupils equal, extraocular movements intact  Respiratory: Patient's speak in full sentences and does not appear short of breath  Cardiovascular: No lower extremity edema, non tender, no erythema  Very minimal tenderness noted over the medial aspect of the knees.  Mild crepitus noted.  No patient no does not have any significant swelling of the patellofemoral joint.    Impression and Recommendations:     The above documentation has been reviewed and is accurate and complete , DO

## 2022-07-26 ENCOUNTER — Telehealth: Payer: Self-pay

## 2022-07-26 NOTE — Telephone Encounter (Signed)
Left message for patient to see if she is coming in for a steroid or visco injection.

## 2022-07-26 NOTE — Telephone Encounter (Signed)
Patient would like to keep appt and will be here tomorrow. Does not feel like she needs injections.

## 2022-07-27 ENCOUNTER — Ambulatory Visit (INDEPENDENT_AMBULATORY_CARE_PROVIDER_SITE_OTHER): Payer: BC Managed Care – PPO | Admitting: Family Medicine

## 2022-07-27 VITALS — BP 108/68 | HR 64 | Ht 67.0 in | Wt 138.0 lb

## 2022-07-27 DIAGNOSIS — M170 Bilateral primary osteoarthritis of knee: Secondary | ICD-10-CM | POA: Diagnosis not present

## 2022-07-27 DIAGNOSIS — M1120 Other chondrocalcinosis, unspecified site: Secondary | ICD-10-CM

## 2022-07-27 NOTE — Assessment & Plan Note (Signed)
Patient is remarkably well at this point.  Taking the Celebrex on a daily basis.  Did change the magnesium to icing and taking seems to be better.  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  Patient is doing well and can follow-up as needed

## 2022-07-27 NOTE — Patient Instructions (Signed)
Good to see you   

## 2022-07-27 NOTE — Assessment & Plan Note (Signed)
Patient has noticed improvement since she was started on a different type of magnesium.  Will try to look into see if that the research involved.  Otherwise likely it is working for her indicating follow-up as needed

## 2022-09-01 ENCOUNTER — Other Ambulatory Visit: Payer: Self-pay | Admitting: Physician Assistant

## 2022-09-01 NOTE — Telephone Encounter (Signed)
Refilled motegrity, schedule for follow up 1-3 months.

## 2022-09-01 NOTE — Telephone Encounter (Signed)
OV scheduled with patient for 10/23/22 at 8:30 am with [place], [place]. She requested a reminder letter be sent home as well.

## 2022-09-19 ENCOUNTER — Encounter: Payer: Self-pay | Admitting: Family Medicine

## 2022-10-23 ENCOUNTER — Ambulatory Visit: Payer: BC Managed Care – PPO | Admitting: Physician Assistant

## 2022-11-02 DIAGNOSIS — Z713 Dietary counseling and surveillance: Secondary | ICD-10-CM | POA: Diagnosis not present

## 2022-11-27 ENCOUNTER — Ambulatory Visit: Payer: BC Managed Care – PPO | Admitting: Physician Assistant

## 2022-11-30 DIAGNOSIS — Z0000 Encounter for general adult medical examination without abnormal findings: Secondary | ICD-10-CM | POA: Diagnosis not present

## 2022-11-30 DIAGNOSIS — Z23 Encounter for immunization: Secondary | ICD-10-CM | POA: Diagnosis not present

## 2022-11-30 DIAGNOSIS — F411 Generalized anxiety disorder: Secondary | ICD-10-CM | POA: Diagnosis not present

## 2022-11-30 DIAGNOSIS — K5900 Constipation, unspecified: Secondary | ICD-10-CM | POA: Diagnosis not present

## 2022-11-30 DIAGNOSIS — F331 Major depressive disorder, recurrent, moderate: Secondary | ICD-10-CM | POA: Diagnosis not present

## 2022-11-30 DIAGNOSIS — M170 Bilateral primary osteoarthritis of knee: Secondary | ICD-10-CM | POA: Diagnosis not present

## 2022-12-01 ENCOUNTER — Other Ambulatory Visit: Payer: Self-pay | Admitting: Family Medicine

## 2022-12-01 DIAGNOSIS — Z1231 Encounter for screening mammogram for malignant neoplasm of breast: Secondary | ICD-10-CM

## 2022-12-06 ENCOUNTER — Other Ambulatory Visit: Payer: Self-pay | Admitting: Physician Assistant

## 2022-12-13 ENCOUNTER — Other Ambulatory Visit: Payer: Self-pay | Admitting: Family Medicine

## 2022-12-13 DIAGNOSIS — E2839 Other primary ovarian failure: Secondary | ICD-10-CM

## 2022-12-30 ENCOUNTER — Other Ambulatory Visit: Payer: Self-pay | Admitting: Family Medicine

## 2023-01-01 DIAGNOSIS — Z7189 Other specified counseling: Secondary | ICD-10-CM | POA: Diagnosis not present

## 2023-01-01 DIAGNOSIS — E785 Hyperlipidemia, unspecified: Secondary | ICD-10-CM | POA: Diagnosis not present

## 2023-01-02 ENCOUNTER — Encounter: Payer: Self-pay | Admitting: Family Medicine

## 2023-01-15 ENCOUNTER — Ambulatory Visit
Admission: RE | Admit: 2023-01-15 | Discharge: 2023-01-15 | Disposition: A | Discharge status: Home / Self Care | Payer: BC Managed Care – PPO | Source: Ambulatory Visit | Attending: Family Medicine | Admitting: Family Medicine

## 2023-01-15 DIAGNOSIS — Z1231 Encounter for screening mammogram for malignant neoplasm of breast: Secondary | ICD-10-CM

## 2023-01-17 ENCOUNTER — Other Ambulatory Visit: Payer: Self-pay | Admitting: Family Medicine

## 2023-01-17 DIAGNOSIS — R928 Other abnormal and inconclusive findings on diagnostic imaging of breast: Secondary | ICD-10-CM

## 2023-01-18 NOTE — Progress Notes (Signed)
01/22/2023 Kaitlyn Dominguez [id number] 1956-01-21  Referring provider: Laurann Montana, MD Primary GI doctor: Dr. Myrtie Neither  ASSESSMENT AND PLAN:   Chronic idiopathic constipation Likely from redundant colon, with pelvic floor component with urinary incontinence.  Continue motegrity with miralax once daily and fiber Will refer to pelvic floor PT for evaluation Recall colon 2029 Declines follow up, will call if any issues and follow up 2 years for RX refills.  Urinary incontinence Likely pelvic floor component, no dysuria, hematuria, etc.  Will refer to pelvic floor PT [facility] [address] Phone: [phone] Fax: [phone]  History of Present Illness:  67 y.o. female  with a past medical history of anxiety, elevated calcium, chronic constipation, hemorrhoids and others listed below, returns to clinic today for evaluation of constipation.  05/02/2018 colonoscopy excellent bowel prep with 2 days Suprep/MiraLAX diverticulosis, redundant colon recall 10 years. 03/2028 11/06/2021 CT abdomen pelvis with contrast small bowel obstruction secondary 4 cm x 3.8 cm inguinal hernia, moderate size hiatal hernia.  Patient lost her son in April 2023, had incarcerated femoral hernia repair March 26, had added trazodone and Ativan and was on opioids for short-term for hernia repair.  Last visit patient had failed inzess, miralax, ducolax, fiber, given trial of Motegrity. Labs showed elevated calcium 11.3, normal albumin normal kidney function, instructed to follow-up with primary care to get vitamin D checked and PTH and stop any calcium supplements or Tums.   No anemia no leukocytosis, normal platelets thyroid normal range. Patient presents for follow-up Recheck calcium 01/01/2023 was normal, PTH within normal range.  She states bowel wise she is doing much better.  She states she is on the motegrity and it has been "a Secretary/administrator", feels she is able to have BM but  every 10 days will take dulcolax/senna and has complete emptying.  She has BM every day, just sometimes they are small and incomplete through out the day.  Urinary incontinence at time, has some lower back pain.  She had a 10 lb vaginal birth years ago.  She will go on medicare in a year and a half.   Current Medications:      Current Outpatient Medications (Analgesics):    acetaminophen (TYLENOL) 500 MG tablet, Take 2 tablets (1,000 mg total) by mouth every 6 (six) hours. (Patient taking differently: Take 1,000 mg by mouth as needed.)   celecoxib (CELEBREX) 200 MG capsule, TAKE 1 TO 2 CAPSULES BY MOUTH EVERY DAY AS NEEDED FOR PAIN   Current Outpatient Medications (Other):    cholecalciferol (VITAMIN D3) 25 MCG (1000 UNIT) tablet, Take 1,000 Units by mouth daily. Pt states she is taking 2000 units.   LORazepam (ATIVAN) 0.5 MG tablet, Take 0.5 mg by mouth 2 (two) times daily as needed.   Magnesium Glycinate 100 MG CAPS, Take 400 capsules by mouth.   Omega-3 Fatty Acids (FISH OIL PO), Take 1 capsule by mouth daily.   polyethylene glycol (MIRALAX / GLYCOLAX) 17 g packet, Take 17 g by mouth daily. (Patient taking differently: Take 17 g by mouth as needed.)   traZODone (DESYREL) 100 MG tablet, Take 100 mg by mouth at bedtime.   venlafaxine (EFFEXOR) 37.5 MG tablet, Take 37.5 mg by mouth daily.   Magnesium Citrate 100 MG CAPS, Take 750 mg by mouth.   Prucalopride Succinate (MOTEGRITY) 2 MG TABS, Take 1 tablet (2 mg total) by mouth daily.  Surgical History:  She  has a past surgical history that  includes Foot surgery; Knee arthroscopy (2001, 2005); Colonoscopy; Shoulder arthroscopy with rotator cuff repair and subacromial decompression (Right, 12/01/2019); Subacromial decompression (Right, 12/01/2019); laparoscopy (N/A, 11/07/2021); and Inguinal hernia repair (N/A, 11/07/2021). Family History:  Her family history includes Alzheimer's disease in her father; Heart attack in her mother; Heart disease  in her maternal grandmother. Social History:   reports that she has never smoked. She has never used smokeless tobacco. She reports current alcohol use of about 3.0 standard drinks of alcohol per week. She reports that she does not use drugs.  Current Medications, Allergies, Past Medical History, Past Surgical History, Family History and Social History were reviewed in [employer] record.  Physical Exam: BP 122/78   Pulse 70   Ht 5\' 7"  (1.702 m)   Wt 139 lb (63 kg)   BMI 21.77 kg/m  General:   Pleasant, well developed female in no acute distress Abdomen:   Soft, Flat AB, Sluggish bowel sounds. no tenderness. Without guarding and Without rebound, No organomegaly appreciated. Rectal: defer Extremities:  Without edema. Msk: Symmetrical without gross deformities. Peripheral pulses intact.  Neurologic:  Alert and  oriented x4;  No focal deficits.  Skin:   Dry and intact without significant lesions or rashes. Psychiatric:  Cooperative. Normal mood and affect.   Doree Albee, PA-C 01/22/23

## 2023-01-22 ENCOUNTER — Ambulatory Visit (INDEPENDENT_AMBULATORY_CARE_PROVIDER_SITE_OTHER): Payer: BC Managed Care – PPO | Admitting: Physician Assistant

## 2023-01-22 ENCOUNTER — Encounter: Payer: Self-pay | Admitting: Physician Assistant

## 2023-01-22 ENCOUNTER — Telehealth: Payer: Self-pay | Admitting: Physician Assistant

## 2023-01-22 VITALS — BP 122/78 | HR 70 | Ht 67.0 in | Wt 139.0 lb

## 2023-01-22 DIAGNOSIS — R32 Unspecified urinary incontinence: Secondary | ICD-10-CM

## 2023-01-22 DIAGNOSIS — K5904 Chronic idiopathic constipation: Secondary | ICD-10-CM

## 2023-01-22 MED ORDER — MOTEGRITY 2 MG PO TABS: 1.0000 | ORAL_TABLET | Freq: Every day | ORAL | 3 refills | Status: DC

## 2023-01-22 NOTE — Patient Instructions (Addendum)
Here some information about pelvic floor dysfunction. This may be contributing to some of your symptoms. Could add miralax and fiber to the motegrity  Miralax is an osmotic laxative.  It only brings more water into the stool.  This is safe to take daily.  Can take up to 17 gram of miralax twice a day.  Mix with juice or coffee.  Start 1 capful at night for 3-4 days and reassess your response in 3-4 days.  You can increase and decrease the dose based on your response.  Remember, it can take up to 3-4 days to take effect OR for the effects to wear off.   I often pair this with benefiber in the morning to help assure the stool is not too loose.   We could also refer to pelvic floor physical therapy.   Pelvic Floor Dysfunction, Female Pelvic floor dysfunction (PFD) is a condition that results when the group of muscles and connective tissues that support the organs in the pelvis (pelvic floor muscles) do not work well. These muscles and their connections form a sling that supports the colon and bladder. In women, they also support the uterus. PFD causes pelvic floor muscles to be too weak, too tight, or both. In PFD, muscle movements are not coordinated. This may cause bowel or bladder problems. It may also cause pain. What are the causes? This condition may be caused by an injury to the pelvic area or by a weakening of pelvic muscles. This often results from pregnancy and childbirth or other types of strain. In many cases, the exact cause is not known. What increases the risk? The following factors may make you more likely to develop this condition: Having chronic bladder tissue inflammation (interstitial cystitis). Being an older person. Being overweight. History of radiation treatment for cancer in the pelvic region. Previous pelvic surgery, such as removal of the uterus (hysterectomy). What are the signs or symptoms? Symptoms of this condition vary and may include: Bladder symptoms,  such as: Trouble starting urination and emptying the bladder. Frequent urinary tract infections. Leaking urine when coughing, laughing, or exercising (stress incontinence). Having to pass urine urgently or frequently. Pain when passing urine. Bowel symptoms, such as: Constipation. Urgent or frequent bowel movements. Incomplete bowel movements. Painful bowel movements. Leaking stool or gas. Unexplained genital or rectal pain. Genital or rectal muscle spasms. Low back pain. Other symptoms may include: A heavy, full, or aching feeling in the vagina. A bulge that protrudes into the vagina. Pain during or after sex. How is this diagnosed? This condition may be diagnosed based on: Your symptoms and medical history. A physical exam. During the exam, your health care provider may check your pelvic muscles for tightness, spasm, pain, or weakness. This may include a rectal exam and a pelvic exam. In some cases, you may have diagnostic tests, such as: Electrical muscle function tests. Urine flow testing. X-ray tests of bowel function. Ultrasound of the pelvic organs. How is this treated? Treatment for this condition depends on the symptoms. Treatment options include: Physical therapy. This may include Kegel exercises to help relax or strengthen the pelvic floor muscles. Biofeedback. This type of therapy provides feedback on how tight your pelvic floor muscles are so that you can learn to control them. Internal or external massage therapy. A treatment that involves electrical stimulation of the pelvic floor muscles to help control pain (transcutaneous electrical nerve stimulation, or TENS). Sound wave therapy (ultrasound) to reduce muscle spasms. Medicines, such as: Muscle relaxants.  Bladder control medicines. Surgery to reconstruct or support pelvic floor muscles may be an option if other treatments do not help. Follow these instructions at home: Activity Do your usual activities as  told by your health care provider. Ask your health care provider if you should modify any activities. Do pelvic floor strengthening or relaxing exercises at home as told by your physical therapist. Lifestyle Maintain a healthy weight. Eat foods that are high in fiber, such as beans, whole grains, and fresh fruits and vegetables. Limit foods that are high in fat and processed sugars, such as fried or sweet foods. Manage stress with relaxation techniques such as yoga or meditation. General instructions If you have problems with leakage: Use absorbable pads or wear padded underwear. Wash frequently with mild soap. Keep your genital and anal area as clean and dry as possible. Ask your health care provider if you should try a barrier cream to prevent skin irritation. Take warm baths to relieve pelvic muscle tension or spasms. Take over-the-counter and prescription medicines only as told by your health care provider. Keep all follow-up visits. How is this prevented? The cause of PFD is not always known, but there are a few things you can do to reduce the risk of developing this condition, including: Staying at a healthy weight. Getting regular exercise. Managing stress. Contact a health care provider if: Your symptoms are not improving with home care. You have signs or symptoms of PFD that get worse at home. You develop new signs or symptoms. You have signs of a urinary tract infection, such as: Fever. Chills. Increased urinary frequency. A burning feeling when urinating. You have not had a bowel movement in 3 days (constipation). Summary Pelvic floor dysfunction results when the muscles and connective tissues in your pelvic floor do not work well. These muscles and their connections form a sling that supports your colon and bladder. In women, they also support the uterus. PFD may be caused by an injury to the pelvic area or by a weakening of pelvic muscles. PFD causes pelvic floor  muscles to be too weak, too tight, or a combination of both. Symptoms may vary from person to person. In most cases, PFD can be treated with physical therapies and medicines. Surgery may be an option if other treatments do not help. This information is not intended to replace advice given to you by your health care provider. Make sure you discuss any questions you have with your health care provider. Document Revised: 12/08/2020 Document Reviewed: 12/08/2020 Elsevier Patient Education  2022 [employer].   I appreciate the  opportunity to care for you  Thank You   [facility]

## 2023-01-22 NOTE — Telephone Encounter (Signed)
Inbound call from Debbie with the referring office for pelvic flooring stating they only do this at their location in [place]. Please advise.

## 2023-01-22 NOTE — Telephone Encounter (Signed)
Please advise Marchelle Folks?

## 2023-01-23 NOTE — Telephone Encounter (Signed)
Send patient my chart message asking if there is a place by her house where we can send the referral. Will wait for reply.

## 2023-01-23 NOTE — Progress Notes (Signed)
____________________________________________________________  Attending physician addendum:  Thank you for sending this case to me. I have reviewed the entire note and agree with the plan.   Aariya Ferrick Danis, MD  ____________________________________________________________  

## 2023-02-05 ENCOUNTER — Ambulatory Visit: Payer: BC Managed Care – PPO

## 2023-02-05 ENCOUNTER — Ambulatory Visit
Admission: RE | Admit: 2023-02-05 | Discharge: 2023-02-05 | Disposition: A | Discharge status: Home / Self Care | Payer: BC Managed Care – PPO | Source: Ambulatory Visit | Attending: Family Medicine | Admitting: Family Medicine

## 2023-02-05 DIAGNOSIS — R928 Other abnormal and inconclusive findings on diagnostic imaging of breast: Secondary | ICD-10-CM

## 2023-02-09 NOTE — Progress Notes (Unsigned)
[facility] [address] [place] [address] Phone: [phone] Subjective:   Kaitlyn Dominguez, am serving as a scribe for Dr. Antoine Primas.  I'm seeing this patient by the request  of:  Laurann Montana, MD  CC: Multiple joint complaints  [id number]  Kaitlyn Dominguez is a 67 y.o. female coming in with complaint of B knee, R foot and L hand pain. Knee steroid injections in September 2023. Patient states slammed finger in car door on Friday. B knee pain and R foot pain since May 12th. Hyperextended her knee and overcompensation may be causing the foot pain on the lateral side near base of the 5th.      Past Medical History:  Diagnosis Date   Anxiety    History of femoral hernia repair 11/07/2021   Osteoarthritis    PONV (postoperative nausea and vomiting)    had emesis after surgery when went home   Serum calcium elevated    Vaginal atrophy    Past Surgical History:  Procedure Laterality Date   COLONOSCOPY     FOOT SURGERY     left; on multiple occasions   INGUINAL HERNIA REPAIR N/A 11/07/2021   Procedure: HERNIA REPAIR INGUINAL INCARCERATED;  Surgeon: Fritzi Mandes, MD;  Location: [facility] OR;  Service: General;  Laterality: N/A;   KNEE ARTHROSCOPY  2001, 2005   left x 2   LAPAROSCOPY N/A 11/07/2021   Procedure: LAPAROSCOPY DIAGNOSTIC;  Surgeon: Fritzi Mandes, MD;  Location: [facility] OR;  Service: General;  Laterality: N/A;   SHOULDER ARTHROSCOPY WITH ROTATOR CUFF REPAIR AND SUBACROMIAL DECOMPRESSION Right 12/01/2019   Procedure: SHOULDER ARTHROSCOPY WITH ROTATOR CUFF REPAIR, DISTAL CLAVICLE EXCISION;  Surgeon: Jones Broom, MD;  Location: [facility];  Service: Orthopedics;  Laterality: Right;   SUBACROMIAL DECOMPRESSION Right 12/01/2019   Procedure: SUBACROMIAL DECOMPRESSION;  Surgeon: Jones Broom, MD;  Location: [facility];  Service: Orthopedics;  Laterality: Right;   Social History   Socioeconomic History    Marital status: Divorced    Spouse name: Not on file   Number of children: 3   Years of education: Not on file   Highest education level: Not on file  Occupational History    Employer: [employer]  Tobacco Use   Smoking status: Never   Smokeless tobacco: Never  Vaping Use   Vaping Use: Never used  Substance and Sexual Activity   Alcohol use: Yes    Alcohol/week: 3.0 standard drinks of alcohol    Types: 3 Glasses of wine per week    Comment: 2-3 drinks/week   Drug use: No   Sexual activity: Not on file  Other Topics Concern   Not on file  Social History Narrative   Not on file   Social Determinants of Health   Financial Resource Strain: Not on file  Food Insecurity: Not on file  Transportation Needs: Not on file  Physical Activity: Not on file  Stress: Not on file  Social Connections: Not on file   Allergies  Allergen Reactions   Penicillins     REACTION: hives   Family History  Problem Relation Age of Onset   Heart attack Mother    Heart disease Maternal Grandmother    Alzheimer's disease Father    Colon cancer Neg Hx    Esophageal cancer Neg Hx    Rectal cancer Neg Hx    Stomach cancer Neg Hx  Current Outpatient Medications (Analgesics):    acetaminophen (TYLENOL) 500 MG tablet, Take 2 tablets (1,000 mg total) by mouth every 6 (six) hours. (Patient taking differently: Take 1,000 mg by mouth as needed.)   celecoxib (CELEBREX) 200 MG capsule, TAKE 1 TO 2 CAPSULES BY MOUTH EVERY DAY AS NEEDED FOR PAIN   Current Outpatient Medications (Other):    cholecalciferol (VITAMIN D3) 25 MCG (1000 UNIT) tablet, Take 1,000 Units by mouth daily. Pt states she is taking 2000 units.   LORazepam (ATIVAN) 0.5 MG tablet, Take 0.5 mg by mouth 2 (two) times daily as needed.   Magnesium Citrate 100 MG CAPS, Take 750 mg by mouth.   Magnesium Glycinate 100 MG CAPS, Take 400 capsules by mouth.   Omega-3 Fatty Acids (FISH OIL PO), Take 1  capsule by mouth daily.   polyethylene glycol (MIRALAX / GLYCOLAX) 17 g packet, Take 17 g by mouth daily. (Patient taking differently: Take 17 g by mouth as needed.)   Prucalopride Succinate (MOTEGRITY) 2 MG TABS, Take 1 tablet (2 mg total) by mouth daily.   traZODone (DESYREL) 100 MG tablet, Take 100 mg by mouth at bedtime.   venlafaxine (EFFEXOR) 37.5 MG tablet, Take 37.5 mg by mouth daily.   Reviewed prior external information including notes and imaging from  primary care provider As well as notes that were available from care everywhere and other healthcare systems.  Past medical history, social, surgical and family history all reviewed in electronic medical record.  No pertanent information unless stated regarding to the chief complaint.   Review of Systems:  No headache, visual changes, nausea, vomiting, diarrhea, constipation, dizziness, abdominal pain, skin rash, fevers, chills, night sweats, weight loss, swollen lymph nodes, body aches, joint swelling, chest pain, shortness of breath, mood changes. POSITIVE muscle aches  Objective  Blood pressure 122/76, pulse 82, height 5\' 7"  (1.702 m), weight 135 lb (61.2 kg), SpO2 96 %.   General: No apparent distress alert and oriented x3 mood and affect normal, dressed appropriately.  HEENT: Pupils equal, extraocular movements intact  Respiratory: Patient's speak in full sentences and does not appear short of breath  Cardiovascular: No lower extremity edema, non tender, no erythema  Bilateral knees do have crepitus noted.  Tender to palpation diffusely.  Patient does not have any increasing no instability.  Patient's index finger, left hand and some bruising noted.  No difficulty with flexion and extension noted.  Neurovascularly intact.  No significant discoloration of the nail noted at this time.  Left foot does show peroneal tendon tenderness noted as well.  Tenderness near the insertion of the fifth metatarsal.   After informed  written and verbal consent, patient was seated on exam table. Right knee was prepped with alcohol swab and utilizing anterolateral approach, patient's right knee space was injected with 4:1  marcaine 0.5%: Kenalog 40mg /dL. Patient tolerated the procedure well without immediate complications.  After informed written and verbal consent, patient was seated on exam table. Left knee was prepped with alcohol swab and utilizing anterolateral approach, patient's left knee space was injected with 4:1  marcaine 0.5%: Kenalog 40mg /dL. Patient tolerated the procedure well without immediate complications.   Impression and Recommendations:      The above documentation has been reviewed and is accurate and complete Judi Saa, DO

## 2023-02-12 ENCOUNTER — Encounter: Payer: Self-pay | Admitting: Family Medicine

## 2023-02-12 ENCOUNTER — Other Ambulatory Visit: Payer: Self-pay

## 2023-02-12 ENCOUNTER — Ambulatory Visit (INDEPENDENT_AMBULATORY_CARE_PROVIDER_SITE_OTHER): Payer: BC Managed Care – PPO

## 2023-02-12 ENCOUNTER — Ambulatory Visit (INDEPENDENT_AMBULATORY_CARE_PROVIDER_SITE_OTHER): Payer: BC Managed Care – PPO | Admitting: Family Medicine

## 2023-02-12 VITALS — BP 122/76 | HR 82 | Ht 67.0 in | Wt 135.0 lb

## 2023-02-12 DIAGNOSIS — S63619A Unspecified sprain of unspecified finger, initial encounter: Secondary | ICD-10-CM | POA: Insufficient documentation

## 2023-02-12 DIAGNOSIS — M19042 Primary osteoarthritis, left hand: Secondary | ICD-10-CM | POA: Diagnosis not present

## 2023-02-12 DIAGNOSIS — M79645 Pain in left finger(s): Secondary | ICD-10-CM | POA: Diagnosis not present

## 2023-02-12 DIAGNOSIS — S63631A Sprain of interphalangeal joint of left index finger, initial encounter: Secondary | ICD-10-CM

## 2023-02-12 DIAGNOSIS — S86302A Unspecified injury of muscle(s) and tendon(s) of peroneal muscle group at lower leg level, left leg, initial encounter: Secondary | ICD-10-CM | POA: Diagnosis not present

## 2023-02-12 DIAGNOSIS — M170 Bilateral primary osteoarthritis of knee: Secondary | ICD-10-CM

## 2023-02-12 NOTE — Patient Instructions (Addendum)
Xrays today [facility] Olymp[employer] 5's Approval for gel See you again in 6-8 weeks

## 2023-02-12 NOTE — Assessment & Plan Note (Signed)
Slammed finger in a door.  Seems to be a bad sprain.  No signs on x-ray of any cortical irregularity.  Over read is pending.  Discussed bracing and buddy taping for range of motion and follow-up again with 2 weeks if not completely resolved

## 2023-02-12 NOTE — Assessment & Plan Note (Signed)
Discussed home exercises, proper shoes, heel lift, icing regimen and topical anti-inflammatories.  Follow-up again in 6 to 8 weeks if worsening symptoms consider formal physical therapy and injections.  Discussed proper shoes which would be beneficial as well.

## 2023-02-12 NOTE — Assessment & Plan Note (Signed)
Bilateral injections given today.  Chronic problem with worsening symptoms.  Has responded well to the steroid and viscosupplementation previously.  Hopefully this will make another significant improvement.  Follow-up with me again 2 to 3 months

## 2023-02-14 ENCOUNTER — Encounter: Payer: Self-pay | Admitting: Family Medicine

## 2023-02-14 ENCOUNTER — Other Ambulatory Visit: Payer: Self-pay

## 2023-02-14 MED ORDER — GABAPENTIN 100 MG PO CAPS: 200.0000 mg | ORAL_CAPSULE | Freq: Every day | ORAL | 0 refills | Status: DC

## 2023-03-07 IMAGING — DX DG KNEE STANDING AP BILAT
1 series · 1 of 1 positions shown · non-contrast
Comparison: None.

CLINICAL DATA: Chronic bilateral knee pain

EXAM:
BILATERAL KNEES STANDING - 1 VIEW

[knee ap]
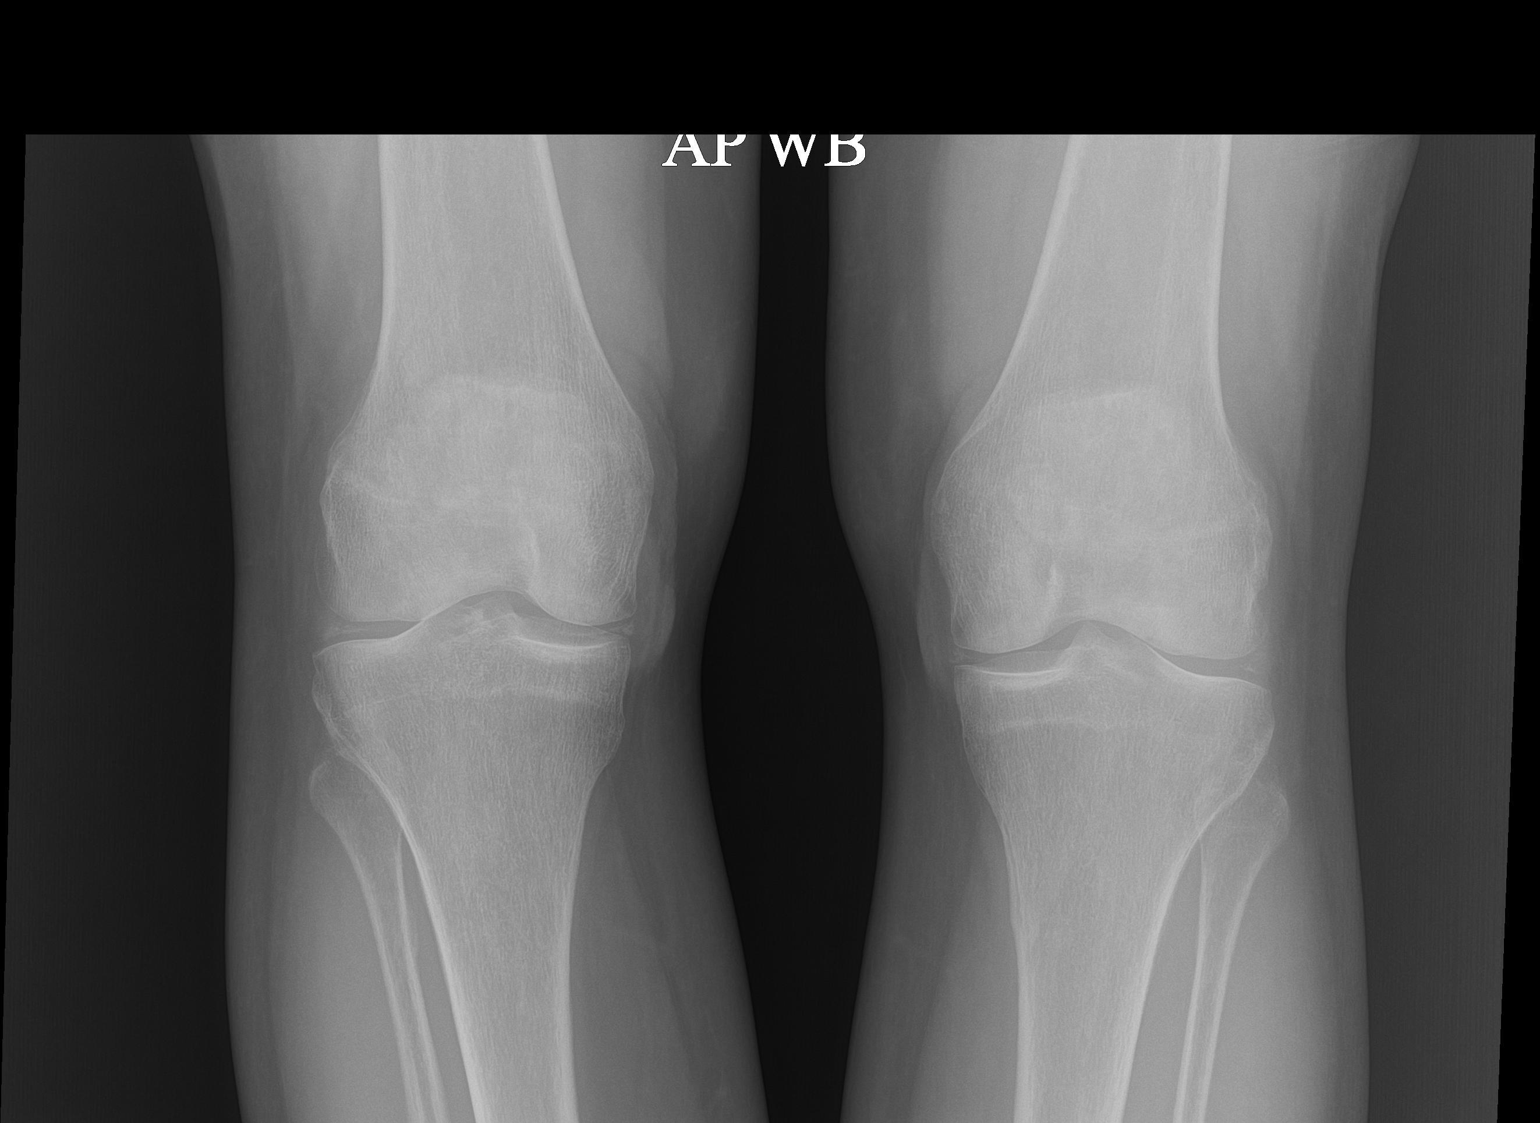

[1 of 1 positions shown; findings below may reference images not displayed]

FINDINGS: Single standing AP view of the bilateral knees demonstrates no
fracture. Slight medial tibiofemoral compartment joint space
narrowing bilaterally. Chondrocalcinosis is present within the
medial and lateral compartments of both knees. Tissue swelling.
IMPRESSION: 1. Slight medial tibiofemoral compartment joint space narrowing
bilaterally.
2. Chondrocalcinosis.

## 2023-03-19 ENCOUNTER — Encounter: Payer: Self-pay | Admitting: Family Medicine

## 2023-03-22 NOTE — Progress Notes (Signed)
[name] [address] [place] [address] Phone: [phone] Subjective:   [name], am serving as a scribe for Dr. Antoine Primas.  I'm seeing this patient by the request  of:  Laurann Montana, MD  CC: Bilateral knee pain  CBJ:[id number]  02/12/2023 Slammed finger in a door.  Seems to be a bad sprain.  No signs on x-ray of any cortical irregularity.  Over read is pending.  Discussed bracing and buddy taping for range of motion and follow-up again with 2 weeks if not completely resolved   Discussed home exercises, proper shoes, heel lift, icing regimen and topical anti-inflammatories.  Follow-up again in 6 to 8 weeks if worsening symptoms consider formal physical therapy and injections.  Discussed proper shoes which would be beneficial as well.     Bilateral injections given today. Chronic problem with worsening symptoms. Has responded well to the steroid and viscosupplementation previously. Hopefully this will make another significant improvement. Follow-up with me again 2 to 3 months   Update 03/23/2023 SAMATHA LOMBERA is a 67 y.o. female coming in with complaint of B knee, L hand, and L ankle pain. Patient states knees are doing well overall s/p steroid injection. Denies swelling or mechanical sx. States that she received a denial letter for Durolane injections. Bigger concern today is pain at the arch of the left foot which has been present x about 1 week.        Past Medical History:  Diagnosis Date   Anxiety    History of femoral hernia repair 11/07/2021   Osteoarthritis    PONV (postoperative nausea and vomiting)    had emesis after surgery when went home   Serum calcium elevated    Vaginal atrophy    Past Surgical History:  Procedure Laterality Date   COLONOSCOPY     FOOT SURGERY     left; on multiple occasions   INGUINAL HERNIA REPAIR N/A 11/07/2021   Procedure: HERNIA REPAIR INGUINAL INCARCERATED;  Surgeon: Fritzi Mandes,  MD;  Location: [facility] OR;  Service: General;  Laterality: N/A;   KNEE ARTHROSCOPY  2001, 2005   left x 2   LAPAROSCOPY N/A 11/07/2021   Procedure: LAPAROSCOPY DIAGNOSTIC;  Surgeon: Fritzi Mandes, MD;  Location: [facility] OR;  Service: General;  Laterality: N/A;   SHOULDER ARTHROSCOPY WITH ROTATOR CUFF REPAIR AND SUBACROMIAL DECOMPRESSION Right 12/01/2019   Procedure: SHOULDER ARTHROSCOPY WITH ROTATOR CUFF REPAIR, DISTAL CLAVICLE EXCISION;  Surgeon: Jones Broom, MD;  Location: [facility];  Service: Orthopedics;  Laterality: Right;   SUBACROMIAL DECOMPRESSION Right 12/01/2019   Procedure: SUBACROMIAL DECOMPRESSION;  Surgeon: Jones Broom, MD;  Location: [facility];  Service: Orthopedics;  Laterality: Right;   Social History   Socioeconomic History   Marital status: Divorced    Spouse name: Not on file   Number of children: 3   Years of education: Not on file   Highest education level: Not on file  Occupational History    Employer: [employer]  Tobacco Use   Smoking status: Never   Smokeless tobacco: Never  Vaping Use   Vaping status: Never Used  Substance and Sexual Activity   Alcohol use: Yes    Alcohol/week: 3.0 standard drinks of alcohol    Types: 3 Glasses of wine per week    Comment: 2-3 drinks/week   Drug use: No   Sexual activity: Not on file  Other Topics Concern  Not on file  Social History Narrative   Not on file   Social Determinants of Health   Financial Resource Strain: Not on file  Food Insecurity: Not on file  Transportation Needs: Not on file  Physical Activity: Not on file  Stress: Not on file  Social Connections: Not on file   Allergies  Allergen Reactions   Penicillins     REACTION: hives   Family History  Problem Relation Age of Onset   Heart attack Mother    Heart disease Maternal Grandmother    Alzheimer's disease Father    Colon cancer Neg Hx    Esophageal cancer Neg Hx    Rectal  cancer Neg Hx    Stomach cancer Neg Hx        Current Outpatient Medications (Analgesics):    acetaminophen (TYLENOL) 500 MG tablet, Take 2 tablets (1,000 mg total) by mouth every 6 (six) hours. (Patient taking differently: Take 1,000 mg by mouth as needed.)   celecoxib (CELEBREX) 200 MG capsule, TAKE 1 TO 2 CAPSULES BY MOUTH EVERY DAY AS NEEDED FOR PAIN   Current Outpatient Medications (Other):    cholecalciferol (VITAMIN D3) 25 MCG (1000 UNIT) tablet, Take 1,000 Units by mouth daily. Pt states she is taking 2000 units.   gabapentin (NEURONTIN) 100 MG capsule, Take 2 capsules (200 mg total) by mouth at bedtime.   LORazepam (ATIVAN) 0.5 MG tablet, Take 0.5 mg by mouth 2 (two) times daily as needed.   Magnesium Citrate 100 MG CAPS, Take 750 mg by mouth.   Magnesium Glycinate 100 MG CAPS, Take 400 capsules by mouth.   Omega-3 Fatty Acids (FISH OIL PO), Take 1 capsule by mouth daily.   polyethylene glycol (MIRALAX / GLYCOLAX) 17 g packet, Take 17 g by mouth daily. (Patient taking differently: Take 17 g by mouth as needed.)   Prucalopride Succinate (MOTEGRITY) 2 MG TABS, Take 1 tablet (2 mg total) by mouth daily.   traZODone (DESYREL) 100 MG tablet, Take 100 mg by mouth at bedtime.   venlafaxine (EFFEXOR) 37.5 MG tablet, Take 37.5 mg by mouth daily.   Reviewed prior external information including notes and imaging from  primary care provider As well as notes that were available from care everywhere and other healthcare systems.  Past medical history, social, surgical and family history all reviewed in electronic medical record.  No pertanent information unless stated regarding to the chief complaint.   Review of Systems:  No headache, visual changes, nausea, vomiting, diarrhea, constipation, dizziness, abdominal pain, skin rash, fevers, chills, night sweats, weight loss, swollen lymph nodes, body aches, joint swelling, chest pain, shortness of breath, mood changes. POSITIVE muscle  aches  Objective  Blood pressure 104/76, pulse 65, height 5\' 7"  (1.702 m), weight 130 lb 3.2 oz (59.1 kg), SpO2 100%.   General: No apparent distress alert and oriented x3 mood and affect normal, dressed appropriately.  HEENT: Pupils equal, extraocular movements intact  Respiratory: Patient's speak in full sentences and does not appear short of breath  Cardiovascular: No lower extremity edema, non tender, no erythema  Antalgic gait noted.  Knee exam bilaterally shows the patient does have some mild crepitus noted.  No significant swelling noted.  The patient lacks last 5 degrees of flexion. Left foot exam shows the patient does have what appears to be an inclusion body on the plantar aspect of the foot.  Tender to palpation.  Freely movable.  Limited muscular skeletal ultrasound was performed and interpreted by Antoine Primas,  M  Limited ultrasound of patient's left plantar aspect of the foot shows the patient does have an inclusion cyst noted.  It is in the soft tissue.  No abnormal blood flow.  No communication with the plantar fascia or underlying musculature.  After informed written and verbal consent, patient was seated on exam table. Right knee was prepped with alcohol swab and utilizing anterolateral approach, patient's right knee space was injected with 60 mg per 3 mL of Durolane (sodium hyaluronate) in a prefilled syringe was injected easily into the knee through a 22-gauge needle..Patient tolerated the procedure well without immediate complications.  After informed written and verbal consent, patient was seated on exam table. Left knee was prepped with alcohol swab and utilizing anterolateral approach, patient's left knee space was injected with 60 mg per 3 mL of Durolane (sodium hyaluronate) in a prefilled syringe was injected easily into the knee through a 22-gauge needle..Patient tolerated the procedure well without immediate complications.    Impression and Recommendations:      The above documentation has been reviewed and is accurate and complete Judi Saa, DO

## 2023-03-26 ENCOUNTER — Ambulatory Visit: Payer: BC Managed Care – PPO | Admitting: Family Medicine

## 2023-03-26 ENCOUNTER — Encounter: Payer: Self-pay | Admitting: Family Medicine

## 2023-03-26 ENCOUNTER — Other Ambulatory Visit: Payer: Self-pay

## 2023-03-26 VITALS — BP 104/76 | HR 65 | Ht 67.0 in | Wt 130.2 lb

## 2023-03-26 DIAGNOSIS — M71372 Other bursal cyst, left ankle and foot: Secondary | ICD-10-CM

## 2023-03-26 DIAGNOSIS — M170 Bilateral primary osteoarthritis of knee: Secondary | ICD-10-CM | POA: Diagnosis not present

## 2023-03-26 DIAGNOSIS — M79672 Pain in left foot: Secondary | ICD-10-CM

## 2023-03-26 MED ORDER — SODIUM HYALURONATE 60 MG/3ML IX PRSY
60.0000 mg | PREFILLED_SYRINGE | Freq: Once | INTRA_ARTICULAR | Status: AC
Start: 2023-03-26 — End: 2023-03-26
  Administered 2023-03-26: 60 mg via INTRA_ARTICULAR

## 2023-03-26 NOTE — Assessment & Plan Note (Signed)
Chronic problem still noted.  Has responded well to viscosupplementation previously and hopefully that we will notice this again.  Discussed icing regimen and home exercises.  Increase activity slowly.  Follow-up again in 6 to 8 weeks otherwise.

## 2023-03-26 NOTE — Assessment & Plan Note (Signed)
Appears to be a cyst on the plantar aspect of the foot.  Freely movable and tender.  Nothing that makes me too concerned at the moment discussed we could consider the aspiration.  Held at this time though.  Increase activity slowly.  Follow-up again in 6 to 8 weeks

## 2023-03-26 NOTE — Patient Instructions (Addendum)
Durolane injection for both knees today  Heat and massage to the inclusion cyst  Gabapentin at night  Follow-up 6 weeks

## 2023-04-30 ENCOUNTER — Other Ambulatory Visit: Payer: Self-pay | Admitting: Physician Assistant

## 2023-04-30 NOTE — Telephone Encounter (Signed)
Please submit for a PA.

## 2023-05-02 ENCOUNTER — Telehealth: Payer: Self-pay

## 2023-05-02 ENCOUNTER — Other Ambulatory Visit (HOSPITAL_COMMUNITY): Payer: Self-pay

## 2023-05-02 NOTE — Telephone Encounter (Signed)
PA request has been Submitted. New Encounter created for follow up. For additional info see Pharmacy Prior Auth telephone encounter from 09/18.

## 2023-05-02 NOTE — Telephone Encounter (Signed)
*  Gastro  Pharmacy Patient Advocate Encounter   Received notification from RX Request Messages that prior authorization for Motegrity 2MG  tablets  is required/requested.   Insurance verification completed.   The patient is insured through [facility] .   Per test claim: PA required; PA submitted to [employer] via CoverMyMeds Key/confirmation #/EOC [id number] Status is pending

## 2023-05-03 ENCOUNTER — Other Ambulatory Visit (HOSPITAL_COMMUNITY): Payer: Self-pay

## 2023-05-04 NOTE — Telephone Encounter (Signed)
PA is still pending.

## 2023-05-04 NOTE — Telephone Encounter (Signed)
Any update on PA?

## 2023-05-07 NOTE — Telephone Encounter (Signed)
Patient informed PA still pending.

## 2023-05-07 NOTE — Telephone Encounter (Signed)
Patient call to f/u on prior auth. Please advise

## 2023-05-07 NOTE — Telephone Encounter (Signed)
Please advice. Patient doesn't feel like the Motegrity is emptying her out and still having to add a laxative at least once a week.

## 2023-05-08 ENCOUNTER — Other Ambulatory Visit (HOSPITAL_COMMUNITY): Payer: Self-pay

## 2023-05-08 NOTE — Telephone Encounter (Signed)
Pharmacy Patient Advocate Encounter  Received notification from [facility] that Prior Authorization for MOTEGRITY 2MG  has been APPROVED from 9.18.24 to 9.18.25. Ran test claim, Copay is $127.65 PER 30 DAYS. This test claim was processed through [facility]- copay amounts may vary at other pharmacies due to pharmacy/plan contracts, or as the patient moves through the different stages of their insurance plan.   PA #/Case ID/Reference #: [id number]

## 2023-05-18 NOTE — Progress Notes (Unsigned)
[name] [address] [place] [address] Phone: [phone] Subjective:   Kaitlyn Dominguez, am serving as a scribe for Dr. Antoine Primas.  I'm seeing this patient by the request  of:  Laurann Montana, MD  CC: Foot pain, bilateral knee pain follow-up  [id number]  03/26/2023 Appears to be a cyst on the plantar aspect of the foot.  Freely movable and tender.  Nothing that makes me too concerned at the moment discussed we could consider the aspiration.  Held at this time though.  Increase activity slowly.  Follow-up again in 6 to 8 weeks     Chronic problem still noted.  Has responded well to viscosupplementation previously and hopefully that we will notice this again.  Discussed icing regimen and home exercises.  Increase activity slowly.  Follow-up again in 6 to 8 weeks otherwise.      Update 05/20/2023 Kaitlyn Dominguez is a 67 y.o. female coming in with complaint of B knee pain and L foot pain. Patient knees are doing much better. Was doing yoga yesterday and had some pain in right knee. R foot seems much improved. L foot is also doing much better.     Past Medical History:  Diagnosis Date   Anxiety    History of femoral hernia repair 11/07/2021   Osteoarthritis    PONV (postoperative nausea and vomiting)    had emesis after surgery when went home   Serum calcium elevated    Vaginal atrophy    Past Surgical History:  Procedure Laterality Date   COLONOSCOPY     FOOT SURGERY     left; on multiple occasions   INGUINAL HERNIA REPAIR N/A 11/07/2021   Procedure: HERNIA REPAIR INGUINAL INCARCERATED;  Surgeon: Fritzi Mandes, MD;  Location: [facility] OR;  Service: General;  Laterality: N/A;   KNEE ARTHROSCOPY  2001, 2005   left x 2   LAPAROSCOPY N/A 11/07/2021   Procedure: LAPAROSCOPY DIAGNOSTIC;  Surgeon: Fritzi Mandes, MD;  Location: [facility] OR;  Service: General;  Laterality: N/A;   SHOULDER ARTHROSCOPY WITH ROTATOR CUFF REPAIR AND SUBACROMIAL  DECOMPRESSION Right 12/01/2019   Procedure: SHOULDER ARTHROSCOPY WITH ROTATOR CUFF REPAIR, DISTAL CLAVICLE EXCISION;  Surgeon: Jones Broom, MD;  Location: [facility];  Service: Orthopedics;  Laterality: Right;   SUBACROMIAL DECOMPRESSION Right 12/01/2019   Procedure: SUBACROMIAL DECOMPRESSION;  Surgeon: Jones Broom, MD;  Location: [facility];  Service: Orthopedics;  Laterality: Right;   Social History   Socioeconomic History   Marital status: Divorced    Spouse name: Not on file   Number of children: 3   Years of education: Not on file   Highest education level: Not on file  Occupational History    Employer: [employer]  Tobacco Use   Smoking status: Never   Smokeless tobacco: Never  Vaping Use   Vaping status: Never Used  Substance and Sexual Activity   Alcohol use: Yes    Alcohol/week: 3.0 standard drinks of alcohol    Types: 3 Glasses of wine per week    Comment: 2-3 drinks/week   Drug use: No   Sexual activity: Not on file  Other Topics Concern   Not on file  Social History Narrative   Not on file   Social Determinants of Health   Financial Resource Strain: Not on file  Food Insecurity: Not on file  Transportation Needs: Not on file  Physical Activity: Not on file  Stress: Not on file  Social Connections: Not on file   Allergies  Allergen Reactions   Penicillins     REACTION: hives   Family History  Problem Relation Age of Onset   Heart attack Mother    Heart disease Maternal Grandmother    Alzheimer's disease Father    Colon cancer Neg Hx    Esophageal cancer Neg Hx    Rectal cancer Neg Hx    Stomach cancer Neg Hx        Current Outpatient Medications (Analgesics):    acetaminophen (TYLENOL) 500 MG tablet, Take 2 tablets (1,000 mg total) by mouth every 6 (six) hours. (Patient taking differently: Take 1,000 mg by mouth as needed.)   celecoxib (CELEBREX) 200 MG capsule, TAKE 1 TO 2  CAPSULES BY MOUTH EVERY DAY AS NEEDED FOR PAIN   Current Outpatient Medications (Other):    cholecalciferol (VITAMIN D3) 25 MCG (1000 UNIT) tablet, Take 1,000 Units by mouth daily. Pt states she is taking 2000 units.   gabapentin (NEURONTIN) 100 MG capsule, Take 2 capsules (200 mg total) by mouth at bedtime.   LORazepam (ATIVAN) 0.5 MG tablet, Take 0.5 mg by mouth 2 (two) times daily as needed.   Magnesium Citrate 100 MG CAPS, Take 750 mg by mouth.   Magnesium Glycinate 100 MG CAPS, Take 400 capsules by mouth.   Omega-3 Fatty Acids (FISH OIL PO), Take 1 capsule by mouth daily.   polyethylene glycol (MIRALAX / GLYCOLAX) 17 g packet, Take 17 g by mouth daily. (Patient taking differently: Take 17 g by mouth as needed.)   Prucalopride Succinate (MOTEGRITY) 2 MG TABS, Take 1 tablet (2 mg total) by mouth daily.   traZODone (DESYREL) 100 MG tablet, Take 100 mg by mouth at bedtime.   venlafaxine (EFFEXOR) 37.5 MG tablet, Take 37.5 mg by mouth daily.   Reviewed prior external information including notes and imaging from  primary care provider As well as notes that were available from care everywhere and other healthcare systems.  Past medical history, social, surgical and family history all reviewed in electronic medical record.  No pertanent information unless stated regarding to the chief complaint.   Review of Systems:  No headache, visual changes, nausea, vomiting, diarrhea, constipation, dizziness, abdominal pain, skin rash, fevers, chills, night sweats, weight loss, swollen lymph nodes, body aches, joint swelling, chest pain, shortness of breath, mood changes. POSITIVE muscle aches  Objective  Blood pressure 108/68, pulse 64, height 5\' 7"  (1.702 m), weight 133 lb (60.3 kg), SpO2 98%.   General: No apparent distress alert and oriented x3 mood and affect normal, dressed appropriately.  HEENT: Pupils equal, extraocular movements intact  Respiratory: Patient's speak in full sentences and does  not appear short of breath  Cardiovascular: No lower extremity edema, non tender, no erythema  Bilateral knees do have arthritic changes but only trace effusion noted of the patellofemoral joint.  Limited muscular skeletal ultrasound was performed and interpreted by Antoine Primas, M   Foot exam does show some hypoechoic changes of the peroneal tendon noted.  Very minimal overall.  No significant cyst noted at the moment.  Plantar aspect of the foot still shows a very mild cyst but nothing significant. Impression: Interval improvement    Impression and Recommendations:     The above documentation has been reviewed and is accurate and complete Judi Saa, DO

## 2023-05-21 ENCOUNTER — Other Ambulatory Visit: Payer: Self-pay

## 2023-05-21 ENCOUNTER — Encounter: Payer: Self-pay | Admitting: Family Medicine

## 2023-05-21 ENCOUNTER — Ambulatory Visit (INDEPENDENT_AMBULATORY_CARE_PROVIDER_SITE_OTHER): Payer: BC Managed Care – PPO | Admitting: Family Medicine

## 2023-05-21 VITALS — BP 108/68 | HR 64 | Ht 67.0 in | Wt 133.0 lb

## 2023-05-21 DIAGNOSIS — S86302A Unspecified injury of muscle(s) and tendon(s) of peroneal muscle group at lower leg level, left leg, initial encounter: Secondary | ICD-10-CM | POA: Diagnosis not present

## 2023-05-21 DIAGNOSIS — M79672 Pain in left foot: Secondary | ICD-10-CM | POA: Diagnosis not present

## 2023-05-21 DIAGNOSIS — M170 Bilateral primary osteoarthritis of knee: Secondary | ICD-10-CM | POA: Diagnosis not present

## 2023-05-21 NOTE — Patient Instructions (Signed)
Good to see you! Everything is looking great Keep wearing the good shoes See you again in 2-3 months

## 2023-05-21 NOTE — Assessment & Plan Note (Signed)
He seem to be doing relatively well at the moment.  Discussed posture and ergonomics, continue to watch patient's weight.  Doing well though with no significant difficulties and follow-up with me again 2 to 3 months

## 2023-05-21 NOTE — Assessment & Plan Note (Signed)
Significant improvement noted at this time.  No significant difficulty.  Increasing activity as tolerated.  Follow-up as needed

## 2023-05-28 DIAGNOSIS — K5900 Constipation, unspecified: Secondary | ICD-10-CM | POA: Diagnosis not present

## 2023-05-28 DIAGNOSIS — F411 Generalized anxiety disorder: Secondary | ICD-10-CM | POA: Diagnosis not present

## 2023-05-28 DIAGNOSIS — I999 Unspecified disorder of circulatory system: Secondary | ICD-10-CM | POA: Diagnosis not present

## 2023-05-28 DIAGNOSIS — F331 Major depressive disorder, recurrent, moderate: Secondary | ICD-10-CM | POA: Diagnosis not present

## 2023-07-09 ENCOUNTER — Ambulatory Visit
Admission: RE | Admit: 2023-07-09 | Discharge: 2023-07-09 | Disposition: A | Discharge status: Home / Self Care | Payer: BC Managed Care – PPO | Source: Ambulatory Visit | Attending: Family Medicine | Admitting: Family Medicine

## 2023-07-09 DIAGNOSIS — E2839 Other primary ovarian failure: Secondary | ICD-10-CM | POA: Diagnosis not present

## 2023-07-09 DIAGNOSIS — M8588 Other specified disorders of bone density and structure, other site: Secondary | ICD-10-CM | POA: Diagnosis not present

## 2023-07-09 DIAGNOSIS — N958 Other specified menopausal and perimenopausal disorders: Secondary | ICD-10-CM | POA: Diagnosis not present

## 2023-07-30 ENCOUNTER — Encounter: Payer: Self-pay | Admitting: Family Medicine

## 2023-07-30 ENCOUNTER — Ambulatory Visit: Payer: BC Managed Care – PPO | Admitting: Family Medicine

## 2023-07-30 VITALS — BP 110/76 | HR 60 | Ht 67.0 in | Wt 134.0 lb

## 2023-07-30 DIAGNOSIS — M1712 Unilateral primary osteoarthritis, left knee: Secondary | ICD-10-CM

## 2023-07-30 DIAGNOSIS — M170 Bilateral primary osteoarthritis of knee: Secondary | ICD-10-CM

## 2023-07-30 DIAGNOSIS — S86302A Unspecified injury of muscle(s) and tendon(s) of peroneal muscle group at lower leg level, left leg, initial encounter: Secondary | ICD-10-CM | POA: Diagnosis not present

## 2023-07-30 NOTE — Assessment & Plan Note (Signed)
Has responded extremely well to injections previously.  Given injection just the left knee.  Responded well to viscosupplementation previously and can repeat if needed.  Repeat was done in August.  Follow-up with me again in 6 to 8 weeks.  Still wants to avoid surgical intervention

## 2023-07-30 NOTE — Assessment & Plan Note (Signed)
Seems to be doing relatively well overall.  Discussed with patient icing regimen and home exercises.  Will continue to monitor for any type of swelling.  Worsening pain will consider injection.

## 2023-07-30 NOTE — Patient Instructions (Addendum)
Injection in knee today Push heel down and massage the tendon beyond the side bone See you again in 2-3 months

## 2023-07-30 NOTE — Progress Notes (Signed)
[name] [address] [place] [address] Phone: [phone] Subjective:   [name], am serving as a scribe for Dr. Antoine Primas.  I'm seeing this patient by the request  of:  Laurann Montana, MD  CC: Left knee, right ankle  [id number]  05/21/2023 Significant improvement noted at this time.  No significant difficulty.  Increasing activity as tolerated.  Follow-up as needed     He seem to be doing relatively well at the moment.  Discussed posture and ergonomics, continue to watch patient's weight.  Doing well though with no significant difficulties and follow-up with me again 2 to 3 months     Updated 07/30/2023 AVAGAIL LUSARDI is a 67 y.o. female coming in with complaint of knee and ankle pain. L knee is not doing well. Feels swollen. Still having some pain and discomfort in R peroneal tendon.       Past Medical History:  Diagnosis Date   Anxiety    History of femoral hernia repair 11/07/2021   Osteoarthritis    PONV (postoperative nausea and vomiting)    had emesis after surgery when went home   Serum calcium elevated    Vaginal atrophy    Past Surgical History:  Procedure Laterality Date   COLONOSCOPY     FOOT SURGERY     left; on multiple occasions   INGUINAL HERNIA REPAIR N/A 11/07/2021   Procedure: HERNIA REPAIR INGUINAL INCARCERATED;  Surgeon: Fritzi Mandes, MD;  Location: [facility] OR;  Service: General;  Laterality: N/A;   KNEE ARTHROSCOPY  2001, 2005   left x 2   LAPAROSCOPY N/A 11/07/2021   Procedure: LAPAROSCOPY DIAGNOSTIC;  Surgeon: Fritzi Mandes, MD;  Location: [facility] OR;  Service: General;  Laterality: N/A;   SHOULDER ARTHROSCOPY WITH ROTATOR CUFF REPAIR AND SUBACROMIAL DECOMPRESSION Right 12/01/2019   Procedure: SHOULDER ARTHROSCOPY WITH ROTATOR CUFF REPAIR, DISTAL CLAVICLE EXCISION;  Surgeon: Jones Broom, MD;  Location: [facility];  Service: Orthopedics;  Laterality: Right;   SUBACROMIAL  DECOMPRESSION Right 12/01/2019   Procedure: SUBACROMIAL DECOMPRESSION;  Surgeon: Jones Broom, MD;  Location: [facility];  Service: Orthopedics;  Laterality: Right;   Social History   Socioeconomic History   Marital status: Divorced    Spouse name: Not on file   Number of children: 3   Years of education: Not on file   Highest education level: Not on file  Occupational History    Employer: [employer]  Tobacco Use   Smoking status: Never   Smokeless tobacco: Never  Vaping Use   Vaping status: Never Used  Substance and Sexual Activity   Alcohol use: Yes    Alcohol/week: 3.0 standard drinks of alcohol    Types: 3 Glasses of wine per week    Comment: 2-3 drinks/week   Drug use: No   Sexual activity: Not on file  Other Topics Concern   Not on file  Social History Narrative   Not on file   Social Drivers of Health   Financial Resource Strain: Not on file  Food Insecurity: Not on file  Transportation Needs: Not on file  Physical Activity: Not on file  Stress: Not on file  Social Connections: Not on file   Allergies  Allergen Reactions   Penicillins     REACTION: hives   Family History  Problem Relation Age of Onset   Heart attack Mother    Heart disease Maternal Grandmother  Alzheimer's disease Father    Colon cancer Neg Hx    Esophageal cancer Neg Hx    Rectal cancer Neg Hx    Stomach cancer Neg Hx        Current Outpatient Medications (Analgesics):    acetaminophen (TYLENOL) 500 MG tablet, Take 2 tablets (1,000 mg total) by mouth every 6 (six) hours. (Patient taking differently: Take 1,000 mg by mouth as needed.)   celecoxib (CELEBREX) 200 MG capsule, TAKE 1 TO 2 CAPSULES BY MOUTH EVERY DAY AS NEEDED FOR PAIN   Current Outpatient Medications (Other):    cholecalciferol (VITAMIN D3) 25 MCG (1000 UNIT) tablet, Take 1,000 Units by mouth daily. Pt states she is taking 2000 units.   gabapentin (NEURONTIN) 100 MG  capsule, Take 2 capsules (200 mg total) by mouth at bedtime.   LORazepam (ATIVAN) 0.5 MG tablet, Take 0.5 mg by mouth 2 (two) times daily as needed.   Magnesium Citrate 100 MG CAPS, Take 750 mg by mouth.   Magnesium Glycinate 100 MG CAPS, Take 400 capsules by mouth.   Omega-3 Fatty Acids (FISH OIL PO), Take 1 capsule by mouth daily.   polyethylene glycol (MIRALAX / GLYCOLAX) 17 g packet, Take 17 g by mouth daily. (Patient taking differently: Take 17 g by mouth as needed.)   Prucalopride Succinate (MOTEGRITY) 2 MG TABS, Take 1 tablet (2 mg total) by mouth daily.   traZODone (DESYREL) 100 MG tablet, Take 100 mg by mouth at bedtime.   venlafaxine (EFFEXOR) 37.5 MG tablet, Take 37.5 mg by mouth daily.   Reviewed prior external information including notes and imaging from  primary care provider As well as notes that were available from care everywhere and other healthcare systems.  Past medical history, social, surgical and family history all reviewed in electronic medical record.  No pertanent information unless stated regarding to the chief complaint.   Review of Systems:  No headache, visual changes, nausea, vomiting, diarrhea, constipation, dizziness, abdominal pain, skin rash, fevers, chills, night sweats, weight loss, swollen lymph nodes, body aches, joint swelling, chest pain, shortness of breath, mood changes. POSITIVE muscle aches  Objective  Blood pressure 110/76, pulse 60, height 5\' 7"  (1.702 m), weight 134 lb (60.8 kg), SpO2 95%.   General: No apparent distress alert and oriented x3 mood and affect normal, dressed appropriately.  HEENT: Pupils equal, extraocular movements intact  Respiratory: Patient's speak in full sentences and does not appear short of breath  Cardiovascular: No lower extremity edema, non tender, no erythema  Mild antalgic gait favoring the left knee more than the right ankle.  Mild tenderness over the peroneal tendons still on the ankle.  Left knee does have  tenderness to palpation mostly over the medial and lateral joint space.  Same in the patellofemoral.  After informed written and verbal consent, patient was seated on exam table. Left knee was prepped with alcohol swab and utilizing anterolateral approach, patient's left knee space was injected with 4:1  marcaine 0.5%: Kenalog 40mg /dL. Patient tolerated the procedure well without immediate complications.    Impression and Recommendations:      The above documentation has been reviewed and is accurate and complete Judi Saa, DO

## 2023-08-20 ENCOUNTER — Ambulatory Visit: Payer: BC Managed Care – PPO | Admitting: Family Medicine

## 2023-10-19 NOTE — Progress Notes (Signed)
 [name] [address] [place] [address] Phone: [phone] Subjective:   [name], am serving as a scribe for Dr. Antoine Primas.  I'm seeing this patient by the request  of:  Laurann Montana, MD  CC: Bilateral knee pain follow-up  [id number]  07/30/2023 Has responded extremely well to injections previously.  Given injection just the left knee.  Responded well to viscosupplementation previously and can repeat if needed.  Repeat was done in August.  Follow-up with me again in 6 to 8 weeks.  Still wants to avoid surgical intervention     Seems to be doing relatively well overall.  Discussed with patient icing regimen and home exercises.  Will continue to monitor for any type of swelling.  Worsening pain will consider injection.     Update 10/22/2023 KIERYN BURTIS is a 68 y.o. female coming in with complaint of B knee and ankle pain. Knees have been doing well. Big hiking trip in Sept so upping mileage on trails. Now at 3 miles. Wants timing on injections for this trip. No other concerns.       Past Medical History:  Diagnosis Date   Anxiety    History of femoral hernia repair 11/07/2021   Osteoarthritis    PONV (postoperative nausea and vomiting)    had emesis after surgery when went home   Serum calcium elevated    Vaginal atrophy    Past Surgical History:  Procedure Laterality Date   COLONOSCOPY     FOOT SURGERY     left; on multiple occasions   INGUINAL HERNIA REPAIR N/A 11/07/2021   Procedure: HERNIA REPAIR INGUINAL INCARCERATED;  Surgeon: Fritzi Mandes, MD;  Location: [facility] OR;  Service: General;  Laterality: N/A;   KNEE ARTHROSCOPY  2001, 2005   left x 2   LAPAROSCOPY N/A 11/07/2021   Procedure: LAPAROSCOPY DIAGNOSTIC;  Surgeon: Fritzi Mandes, MD;  Location: [facility] OR;  Service: General;  Laterality: N/A;   SHOULDER ARTHROSCOPY WITH ROTATOR CUFF REPAIR AND SUBACROMIAL DECOMPRESSION Right 12/01/2019   Procedure: SHOULDER  ARTHROSCOPY WITH ROTATOR CUFF REPAIR, DISTAL CLAVICLE EXCISION;  Surgeon: Jones Broom, MD;  Location: [facility];  Service: Orthopedics;  Laterality: Right;   SUBACROMIAL DECOMPRESSION Right 12/01/2019   Procedure: SUBACROMIAL DECOMPRESSION;  Surgeon: Jones Broom, MD;  Location: [facility];  Service: Orthopedics;  Laterality: Right;   Social History   Socioeconomic History   Marital status: Divorced    Spouse name: Not on file   Number of children: 3   Years of education: Not on file   Highest education level: Not on file  Occupational History    Employer: [employer]  Tobacco Use   Smoking status: Never   Smokeless tobacco: Never  Vaping Use   Vaping status: Never Used  Substance and Sexual Activity   Alcohol use: Yes    Alcohol/week: 3.0 standard drinks of alcohol    Types: 3 Glasses of wine per week    Comment: 2-3 drinks/week   Drug use: No   Sexual activity: Not on file  Other Topics Concern   Not on file  Social History Narrative   Not on file   Social Drivers of Health   Financial Resource Strain: Not on file  Food Insecurity: Not on file  Transportation Needs: Not on file  Physical Activity: Not on file  Stress: Not on file  Social Connections: Not on file  Allergies  Allergen Reactions   Penicillins     REACTION: hives   Family History  Problem Relation Age of Onset   Heart attack Mother    Heart disease Maternal Grandmother    Alzheimer's disease Father    Colon cancer Neg Hx    Esophageal cancer Neg Hx    Rectal cancer Neg Hx    Stomach cancer Neg Hx        Current Outpatient Medications (Analgesics):    acetaminophen (TYLENOL) 500 MG tablet, Take 2 tablets (1,000 mg total) by mouth every 6 (six) hours. (Patient taking differently: Take 1,000 mg by mouth as needed.)   celecoxib (CELEBREX) 200 MG capsule, TAKE 1 TO 2 CAPSULES BY MOUTH EVERY DAY AS NEEDED FOR PAIN   Current  Outpatient Medications (Other):    cholecalciferol (VITAMIN D3) 25 MCG (1000 UNIT) tablet, Take 1,000 Units by mouth daily. Pt states she is taking 2000 units.   gabapentin (NEURONTIN) 100 MG capsule, Take 2 capsules (200 mg total) by mouth at bedtime.   LORazepam (ATIVAN) 0.5 MG tablet, Take 0.5 mg by mouth 2 (two) times daily as needed.   Magnesium Citrate 100 MG CAPS, Take 750 mg by mouth.   Magnesium Glycinate 100 MG CAPS, Take 400 capsules by mouth.   Omega-3 Fatty Acids (FISH OIL PO), Take 1 capsule by mouth daily.   polyethylene glycol (MIRALAX / GLYCOLAX) 17 g packet, Take 17 g by mouth daily. (Patient taking differently: Take 17 g by mouth as needed.)   Prucalopride Succinate (MOTEGRITY) 2 MG TABS, Take 1 tablet (2 mg total) by mouth daily.   traZODone (DESYREL) 100 MG tablet, Take 100 mg by mouth at bedtime.   venlafaxine (EFFEXOR) 37.5 MG tablet, Take 37.5 mg by mouth daily.   Objective  Blood pressure 116/72, pulse (!) 56, height 5\' 7"  (1.702 m), weight 138 lb (62.6 kg), SpO2 97%.   General: No apparent distress alert and oriented x3 mood and affect normal, dressed appropriately.  HEENT: Pupils equal, extraocular movements intact  Respiratory: Patient's speak in full sentences and does not appear short of breath  Cardiovascular: No lower extremity edema, non tender, no erythema  Patient does have some mild crepitus noted.  Not severe overall. Patient does have more positive patellar grind left greater than right.   Impression and Recommendations:     The above documentation has been reviewed and is accurate and complete Judi Saa, DO

## 2023-10-22 ENCOUNTER — Ambulatory Visit: Payer: BC Managed Care – PPO | Admitting: Family Medicine

## 2023-10-22 VITALS — BP 116/72 | HR 56 | Ht 67.0 in | Wt 138.0 lb

## 2023-10-22 DIAGNOSIS — M170 Bilateral primary osteoarthritis of knee: Secondary | ICD-10-CM

## 2023-10-22 NOTE — Assessment & Plan Note (Signed)
 Has been sometime since we have done viscosupplementation.  Would like to consider doing this again with patient having great success with the previously.  Patient will continue with conservative therapy and will follow-up with me again in 3 months.  Patient needs me sooner

## 2023-10-22 NOTE — Addendum Note (Signed)
 Addended by: Ethlyn Daniels on: 10/22/2023 01:05 PM   Modules accepted: Orders

## 2023-10-22 NOTE — Patient Instructions (Signed)
 Good to see you! Read about PRP, but don' think you need it yet We will get approval for gel See you again in 3 months

## 2023-11-02 ENCOUNTER — Telehealth: Payer: Self-pay

## 2023-11-02 NOTE — Telephone Encounter (Signed)
**  patient scheduled for 01/28/24**   Durolane authorized for bilateral knee Durolane [id number] and administration 20610/20611 are covered at 100% of allowable amount. Deductibles do not apply to these services. Patient has a $100 copay whether, or not an office visit is billed. Only one copay applies per date of service. If out of pocket is met, copay will no longer apply. No referrals needed. Durolane [id number] is a non-preferred with [employer] [place]. [place] approved Authorization number [id number] 10/25/23-04/22/24 Document scanned

## 2023-11-05 ENCOUNTER — Other Ambulatory Visit (HOSPITAL_COMMUNITY): Payer: Self-pay

## 2023-11-05 NOTE — Telephone Encounter (Signed)
 Noted.

## 2023-11-08 ENCOUNTER — Other Ambulatory Visit (HOSPITAL_COMMUNITY): Payer: Self-pay

## 2023-11-12 ENCOUNTER — Other Ambulatory Visit: Payer: Self-pay | Admitting: Family Medicine

## 2023-11-12 ENCOUNTER — Other Ambulatory Visit (HOSPITAL_COMMUNITY): Payer: Self-pay

## 2023-11-12 ENCOUNTER — Telehealth: Payer: Self-pay | Admitting: Family Medicine

## 2023-11-12 MED ORDER — VENLAFAXINE HCL 37.5 MG PO TABS
37.5000 mg | ORAL_TABLET | Freq: Every day | ORAL | 1 refills | Status: DC
  Filled 2023-11-12 – 2023-11-13 (×2): qty 90, 90d supply, fill #0

## 2023-11-12 NOTE — Telephone Encounter (Signed)
 Please refill Celebrex.  Note new pharmacy: [facility]

## 2023-11-13 ENCOUNTER — Other Ambulatory Visit (HOSPITAL_COMMUNITY): Payer: Self-pay

## 2023-11-13 ENCOUNTER — Other Ambulatory Visit: Payer: Self-pay

## 2023-11-13 ENCOUNTER — Other Ambulatory Visit (HOSPITAL_BASED_OUTPATIENT_CLINIC_OR_DEPARTMENT_OTHER): Payer: Self-pay

## 2023-11-13 MED ORDER — CELECOXIB 200 MG PO CAPS
200.0000 mg | ORAL_CAPSULE | Freq: Every day | ORAL | 1 refills | Status: DC | PRN
  Filled 2023-11-13 (×2): qty 180, 90d supply, fill #0
  Filled 2024-02-23: qty 180, 90d supply, fill #1

## 2023-11-13 NOTE — Telephone Encounter (Signed)
 Per a verbal from Dr Katrinka Blazing, ok to refill Celebrex.

## 2023-12-24 ENCOUNTER — Other Ambulatory Visit (HOSPITAL_COMMUNITY): Payer: Self-pay

## 2023-12-24 DIAGNOSIS — R051 Acute cough: Secondary | ICD-10-CM | POA: Diagnosis not present

## 2023-12-24 DIAGNOSIS — J069 Acute upper respiratory infection, unspecified: Secondary | ICD-10-CM | POA: Diagnosis not present

## 2023-12-24 MED ORDER — PROMETHAZINE-DM 6.25-15 MG/5ML PO SYRP
5.0000 mL | ORAL_SOLUTION | Freq: Every day | ORAL | 0 refills | Status: AC
  Filled 2023-12-24: qty 50, 10d supply, fill #0

## 2024-01-24 NOTE — Progress Notes (Addendum)
 [name] [address] [place] [address] Phone: [phone] Subjective:   Kaitlyn Dominguez Kaitlyn Dominguez, am serving as a scribe for Dr. Arthea Claudene.  I'm seeing this patient by the request  of:  Teresa Channel, MD  CC: Knee pain  YEP:Dlagzrupcz  10/22/2023 Has been sometime since we have done viscosupplementation.  Would like to consider doing this again with patient having great success with the previously.  Patient will continue with conservative therapy and will follow-up with me again in 3 months.  Patient needs me sooner      Update 01/28/2024 Kaitlyn Dominguez is a 68 y.o. female coming in with complaint of B knee pain. Durolane authorized. Patient states that she has not been walking as much due to illness.  Also recently noticed over the course of several weeks started having a swelling on the DIP of the index finger.  Tender to palpation affecting and motion.     Past Medical History:  Diagnosis Date   Anxiety    History of femoral hernia repair 11/07/2021   Osteoarthritis    PONV (postoperative nausea and vomiting)    had emesis after surgery when went home   Serum calcium elevated    Vaginal atrophy    Past Surgical History:  Procedure Laterality Date   COLONOSCOPY     FOOT SURGERY     left; on multiple occasions   INGUINAL HERNIA REPAIR N/A 11/07/2021   Procedure: HERNIA REPAIR INGUINAL INCARCERATED;  Surgeon: Dasie Leonor CROME, MD;  Location: [facility] OR;  Service: General;  Laterality: N/A;   KNEE ARTHROSCOPY  2001, 2005   left x 2   LAPAROSCOPY N/A 11/07/2021   Procedure: LAPAROSCOPY DIAGNOSTIC;  Surgeon: Dasie Leonor CROME, MD;  Location: [facility] OR;  Service: General;  Laterality: N/A;   SHOULDER ARTHROSCOPY WITH ROTATOR CUFF REPAIR AND SUBACROMIAL DECOMPRESSION Right 12/01/2019   Procedure: SHOULDER ARTHROSCOPY WITH ROTATOR CUFF REPAIR, DISTAL CLAVICLE EXCISION;  Surgeon: Dozier Soulier, MD;  Location: [facility];  Service:  Orthopedics;  Laterality: Right;   SUBACROMIAL DECOMPRESSION Right 12/01/2019   Procedure: SUBACROMIAL DECOMPRESSION;  Surgeon: Dozier Soulier, MD;  Location: [facility];  Service: Orthopedics;  Laterality: Right;   Social History   Socioeconomic History   Marital status: Divorced    Spouse name: Not on file   Number of children: 3   Years of education: Not on file   Highest education level: Not on file  Occupational History    Employer: [employer]  Tobacco Use   Smoking status: Never   Smokeless tobacco: Never  Vaping Use   Vaping status: Never Used  Substance and Sexual Activity   Alcohol use: Yes    Alcohol/week: 3.0 standard drinks of alcohol    Types: 3 Glasses of wine per week    Comment: 2-3 drinks/week   Drug use: No   Sexual activity: Not on file  Other Topics Concern   Not on file  Social History Narrative   Not on file   Social Drivers of Health   Financial Resource Strain: Not on file  Food Insecurity: Not on file  Transportation Needs: Not on file  Physical Activity: Not on file  Stress: Not on file  Social Connections: Not on file   Allergies  Allergen Reactions   Penicillins     REACTION: hives   Family History  Problem Relation Age of Onset   Heart attack Mother  Heart disease Maternal Grandmother    Alzheimer's disease Father    Colon cancer Neg Hx    Esophageal cancer Neg Hx    Rectal cancer Neg Hx    Stomach cancer Neg Hx       Current Outpatient Medications (Respiratory):    promethazine -dextromethorphan (PROMETHAZINE -DM) 6.25-15 MG/5ML syrup, Take 5 mLs by mouth at bedtime for 10 days.  Current Outpatient Medications (Analgesics):    acetaminophen  (TYLENOL ) 500 MG tablet, Take 2 tablets (1,000 mg total) by mouth every 6 (six) hours. (Patient taking differently: Take 1,000 mg by mouth as needed.)   celecoxib  (CELEBREX ) 200 MG capsule, Take 1-2 capsules (200-400 mg total) by mouth daily  as needed for pain.   Current Outpatient Medications (Other):    cholecalciferol (VITAMIN D3) 25 MCG (1000 UNIT) tablet, Take 1,000 Units by mouth daily. Pt states she is taking 2000 units.   doxycycline  (VIBRA -TABS) 100 MG tablet, Take 1 tablet (100 mg total) by mouth 2 (two) times daily.   venlafaxine  (EFFEXOR ) 37.5 MG tablet, Take 1 tablet (37.5 mg total) by mouth daily with food.   Reviewed prior external information including notes and imaging from  primary care provider As well as notes that were available from care everywhere and other healthcare systems.  Past medical history, social, surgical and family history all reviewed in electronic medical record.  No pertanent information unless stated regarding to the chief complaint.   Review of Systems:  No headache, visual changes, nausea, vomiting, diarrhea, constipation, dizziness, abdominal pain, skin rash, fevers, chills, night sweats, weight loss, swollen lymph nodes, body aches, joint swelling, chest pain, shortness of breath, mood changes. POSITIVE muscle aches  Objective  Blood pressure 110/74, height 5' 7 (1.702 m), weight 133 lb (60.3 kg).   General: No apparent distress alert and oriented x3 mood and affect normal, dressed appropriately.  HEENT: Pupils equal, extraocular movements intact  Respiratory: Patient's speak in full sentences and does not appear short of breath  Cardiovascular: No lower extremity edema, non tender, no erythema  Finger exam on the left DIP index finger does have a cyst noted.  Superficial.  No erythema or warmness.  No skin breakdown  Significant knee arthritis bilaterally.  Instability noted with valgus and varus force.  Limited range of motion lacking last 5 degrees of extension in the last 10 degrees of flexion bilaterally  After informed written and verbal consent, patient was seated on exam table. Right knee was prepped with alcohol swab and utilizing anterolateral approach, patient's right  knee space was injected with 60 mg per 3 mL of Durolane (sodium hyaluronate) in a prefilled syringe was injected easily into the knee through a 22-gauge needle..Patient tolerated the procedure well without immediate complications.  After informed written and verbal consent, patient was seated on exam table. Left knee was prepped with alcohol swab and utilizing anterolateral approach, patient's left knee space was injected with 60 mg per 3 mL of Durolane (sodium hyaluronate) in a prefilled syringe was injected easily into the knee through a 22-gauge needle..Patient tolerated the procedure well without immediate complications.   After verbal consent patient was prepped with alcohol swab and with a 21-gauge half inch needle injecting 0.5 cc of 0.5% Marcaine  and aspirated jellylike fluid consistent with a ganglion cyst right index dip joint .  Very mild blood loss noted.  Band-Aid placed.  Postinjection instructions given.   Impression and Recommendations:     The above documentation has been reviewed and is accurate and complete  Dicie Edelen M Fard Borunda, DO

## 2024-01-28 ENCOUNTER — Encounter: Payer: Self-pay | Admitting: Family Medicine

## 2024-01-28 ENCOUNTER — Ambulatory Visit: Admitting: Family Medicine

## 2024-01-28 ENCOUNTER — Other Ambulatory Visit (HOSPITAL_BASED_OUTPATIENT_CLINIC_OR_DEPARTMENT_OTHER): Payer: Self-pay

## 2024-01-28 ENCOUNTER — Other Ambulatory Visit (HOSPITAL_COMMUNITY): Payer: Self-pay

## 2024-01-28 VITALS — BP 110/74 | Ht 67.0 in | Wt 133.0 lb

## 2024-01-28 DIAGNOSIS — M67441 Ganglion, right hand: Secondary | ICD-10-CM

## 2024-01-28 DIAGNOSIS — M67449 Ganglion, unspecified hand: Secondary | ICD-10-CM | POA: Insufficient documentation

## 2024-01-28 DIAGNOSIS — M170 Bilateral primary osteoarthritis of knee: Secondary | ICD-10-CM

## 2024-01-28 MED ORDER — DOXYCYCLINE HYCLATE 100 MG PO TABS
100.0000 mg | ORAL_TABLET | Freq: Two times a day (BID) | ORAL | 0 refills | Status: AC
  Filled 2024-01-28 (×2): qty 14, 7d supply, fill #0

## 2024-01-28 MED ORDER — SODIUM HYALURONATE 60 MG/3ML IX PRSY
120.0000 mg | PREFILLED_SYRINGE | Freq: Once | INTRA_ARTICULAR | Status: AC
Start: 2024-01-28 — End: 2024-01-28
  Administered 2024-01-28: 120 mg via INTRA_ARTICULAR

## 2024-01-28 NOTE — Patient Instructions (Signed)
 See me beginning of September  Doxy sent in to pharmacy

## 2024-01-28 NOTE — Assessment & Plan Note (Signed)
 Aspiration of what appears to be more of a ganglion cyst done today.  We discussed the potential for reaccumulation and possible need for surgical intervention.  I am hopeful that this will make a difference.  Doxycycline  given today secondary to the amount of manipulation that had to be done today.  Discussed avoiding certain activities.  Follow-up again in 6 to 8 weeks otherwise.

## 2024-01-28 NOTE — Assessment & Plan Note (Signed)
 Chronic, with exacerbation again.  Worsening symptoms but is making some progress.  Has been having injections intermittently for 3 years and seems to be doing relatively well.  Has a large trip planned in September.  Do think that this will be the best way to continue to keep her active.  Follow-up with me again in 2 to 3 months otherwise

## 2024-02-23 ENCOUNTER — Other Ambulatory Visit (HOSPITAL_COMMUNITY): Payer: Self-pay

## 2024-02-25 ENCOUNTER — Other Ambulatory Visit: Payer: Self-pay

## 2024-02-25 ENCOUNTER — Other Ambulatory Visit (HOSPITAL_COMMUNITY): Payer: Self-pay

## 2024-02-26 ENCOUNTER — Other Ambulatory Visit (HOSPITAL_COMMUNITY): Payer: Self-pay

## 2024-02-26 ENCOUNTER — Other Ambulatory Visit: Payer: Self-pay

## 2024-02-26 MED ORDER — VENLAFAXINE HCL 37.5 MG PO TABS
37.5000 mg | ORAL_TABLET | Freq: Every day | ORAL | 0 refills | Status: DC
  Filled 2024-02-26: qty 90, 90d supply, fill #0

## 2024-03-17 ENCOUNTER — Other Ambulatory Visit (HOSPITAL_COMMUNITY): Payer: Self-pay

## 2024-03-17 ENCOUNTER — Other Ambulatory Visit (HOSPITAL_BASED_OUTPATIENT_CLINIC_OR_DEPARTMENT_OTHER): Payer: Self-pay

## 2024-03-17 DIAGNOSIS — E785 Hyperlipidemia, unspecified: Secondary | ICD-10-CM | POA: Diagnosis not present

## 2024-03-17 DIAGNOSIS — K5900 Constipation, unspecified: Secondary | ICD-10-CM | POA: Diagnosis not present

## 2024-03-17 DIAGNOSIS — F411 Generalized anxiety disorder: Secondary | ICD-10-CM | POA: Diagnosis not present

## 2024-03-17 DIAGNOSIS — Z0000 Encounter for general adult medical examination without abnormal findings: Secondary | ICD-10-CM | POA: Diagnosis not present

## 2024-03-17 DIAGNOSIS — E559 Vitamin D deficiency, unspecified: Secondary | ICD-10-CM | POA: Diagnosis not present

## 2024-03-17 DIAGNOSIS — Z124 Encounter for screening for malignant neoplasm of cervix: Secondary | ICD-10-CM | POA: Diagnosis not present

## 2024-03-17 DIAGNOSIS — M170 Bilateral primary osteoarthritis of knee: Secondary | ICD-10-CM | POA: Diagnosis not present

## 2024-03-17 MED ORDER — LINZESS 290 MCG PO CAPS
290.0000 ug | ORAL_CAPSULE | Freq: Every day | ORAL | 11 refills | Status: DC | PRN
  Filled 2024-03-17: qty 30, 30d supply, fill #0

## 2024-03-17 MED ORDER — LINZESS 72 MCG PO CAPS
72.0000 ug | ORAL_CAPSULE | Freq: Every day | ORAL | 11 refills | Status: AC
  Filled 2024-03-17 – 2024-03-24 (×3): qty 30, 30d supply, fill #0
  Filled 2024-04-16: qty 30, 30d supply, fill #1

## 2024-03-17 MED ORDER — VENLAFAXINE HCL 37.5 MG PO TABS
37.5000 mg | ORAL_TABLET | Freq: Every day | ORAL | 1 refills | Status: AC
  Filled 2024-03-17 – 2024-06-13 (×5): qty 90, 90d supply, fill #0
  Filled 2024-09-11: qty 90, 90d supply, fill #1

## 2024-03-18 ENCOUNTER — Other Ambulatory Visit: Payer: Self-pay

## 2024-03-18 ENCOUNTER — Other Ambulatory Visit: Payer: Self-pay | Admitting: Family Medicine

## 2024-03-18 ENCOUNTER — Other Ambulatory Visit (HOSPITAL_COMMUNITY): Payer: Self-pay

## 2024-03-18 DIAGNOSIS — R0989 Other specified symptoms and signs involving the circulatory and respiratory systems: Secondary | ICD-10-CM

## 2024-03-19 ENCOUNTER — Other Ambulatory Visit (HOSPITAL_COMMUNITY): Payer: Self-pay

## 2024-03-20 ENCOUNTER — Other Ambulatory Visit (HOSPITAL_COMMUNITY): Payer: Self-pay

## 2024-03-24 ENCOUNTER — Other Ambulatory Visit (HOSPITAL_COMMUNITY): Payer: Self-pay

## 2024-03-24 ENCOUNTER — Other Ambulatory Visit: Payer: Self-pay

## 2024-03-31 ENCOUNTER — Ambulatory Visit
Admission: RE | Admit: 2024-03-31 | Discharge: 2024-03-31 | Disposition: A | Discharge status: Home / Self Care | Source: Ambulatory Visit | Attending: Family Medicine | Admitting: Family Medicine

## 2024-03-31 DIAGNOSIS — R600 Localized edema: Secondary | ICD-10-CM | POA: Diagnosis not present

## 2024-03-31 DIAGNOSIS — R0989 Other specified symptoms and signs involving the circulatory and respiratory systems: Secondary | ICD-10-CM

## 2024-04-17 ENCOUNTER — Other Ambulatory Visit (HOSPITAL_COMMUNITY): Payer: Self-pay

## 2024-05-01 NOTE — Progress Notes (Signed)
 [facility] [address] [place] [address] Phone: [phone] Subjective:    I'm seeing this patient by the request  of:  Teresa Channel, MD  CC: knee pain   YEP:Dlagzrupcz  01/28/2024 Chronic, with exacerbation again.  Worsening symptoms but is making some progress.  Has been having injections intermittently for 3 years and seems to be doing relatively well.  Has a large trip planned in September.  Do think that this will be the best way to continue to keep her active.  Follow-up with me again in 2 to 3 months otherwise     Aspiration of what appears to be more of a ganglion cyst done today.  We discussed the potential for reaccumulation and possible need for surgical intervention.  I am hopeful that this will make a difference.  Doxycycline  given today secondary to the amount of manipulation that had to be done today.  Discussed avoiding certain activities.  Follow-up again in 6 to 8 weeks otherwise.     Update 05/02/2024 Kaitlyn Dominguez is a 68 y.o. female coming in with complaint of B knee and R index finger pain. Patient states       Past Medical History:  Diagnosis Date   Anxiety    History of femoral hernia repair 11/07/2021   Osteoarthritis    PONV (postoperative nausea and vomiting)    had emesis after surgery when went home   Serum calcium elevated    Vaginal atrophy    Past Surgical History:  Procedure Laterality Date   COLONOSCOPY     FOOT SURGERY     left; on multiple occasions   INGUINAL HERNIA REPAIR N/A 11/07/2021   Procedure: HERNIA REPAIR INGUINAL INCARCERATED;  Surgeon: Dasie Leonor CROME, MD;  Location: [facility] OR;  Service: General;  Laterality: N/A;   KNEE ARTHROSCOPY  2001, 2005   left x 2   LAPAROSCOPY N/A 11/07/2021   Procedure: LAPAROSCOPY DIAGNOSTIC;  Surgeon: Dasie Leonor CROME, MD;  Location: [facility] OR;  Service: General;  Laterality: N/A;   SHOULDER ARTHROSCOPY WITH ROTATOR CUFF REPAIR AND SUBACROMIAL DECOMPRESSION Right  12/01/2019   Procedure: SHOULDER ARTHROSCOPY WITH ROTATOR CUFF REPAIR, DISTAL CLAVICLE EXCISION;  Surgeon: Dozier Soulier, MD;  Location: [facility];  Service: Orthopedics;  Laterality: Right;   SUBACROMIAL DECOMPRESSION Right 12/01/2019   Procedure: SUBACROMIAL DECOMPRESSION;  Surgeon: Dozier Soulier, MD;  Location: [facility];  Service: Orthopedics;  Laterality: Right;   Social History   Socioeconomic History   Marital status: Divorced    Spouse name: Not on file   Number of children: 3   Years of education: Not on file   Highest education level: Not on file  Occupational History    Employer: [employer]  Tobacco Use   Smoking status: Never   Smokeless tobacco: Never  Vaping Use   Vaping status: Never Used  Substance and Sexual Activity   Alcohol use: Yes    Alcohol/week: 3.0 standard drinks of alcohol    Types: 3 Glasses of wine per week    Comment: 2-3 drinks/week   Drug use: No   Sexual activity: Not on file  Other Topics Concern   Not on file  Social History Narrative   Not on file   Social Drivers of Health   Financial Resource Strain: Not on file  Food Insecurity: Not on file  Transportation Needs: Not on file  Physical Activity: Not on file  Stress:  Not on file  Social Connections: Not on file   Allergies  Allergen Reactions   Penicillins     REACTION: hives   Family History  Problem Relation Age of Onset   Heart attack Mother    Heart disease Maternal Grandmother    Alzheimer's disease Father    Colon cancer Neg Hx    Esophageal cancer Neg Hx    Rectal cancer Neg Hx    Stomach cancer Neg Hx       Current Outpatient Medications (Respiratory):    promethazine -dextromethorphan (PROMETHAZINE -DM) 6.25-15 MG/5ML syrup, Take 5 mLs by mouth at bedtime for 10 days.  Current Outpatient Medications (Analgesics):    acetaminophen  (TYLENOL ) 500 MG tablet, Take 2 tablets (1,000 mg total) by mouth  every 6 (six) hours. (Patient taking differently: Take 1,000 mg by mouth as needed.)   celecoxib  (CELEBREX ) 200 MG capsule, Take 1-2 capsules (200-400 mg total) by mouth daily as needed for pain.   Current Outpatient Medications (Other):    cholecalciferol (VITAMIN D3) 25 MCG (1000 UNIT) tablet, Take 1,000 Units by mouth daily. Pt states she is taking 2000 units.   doxycycline  (VIBRA -TABS) 100 MG tablet, Take 1 tablet (100 mg total) by mouth 2 (two) times daily.   linaclotide  (LINZESS ) 72 MCG capsule, Take 1 capsule (72 mcg total) by mouth daily at least 30 minutes before the first meal on an empty stomach.   venlafaxine  (EFFEXOR ) 37.5 MG tablet, Take 1 tablet (37.5 mg total) by mouth daily with food.   Reviewed prior external information including notes and imaging from  primary care provider As well as notes that were available from care everywhere and other healthcare systems.  Past medical history, social, surgical and family history all reviewed in electronic medical record.  No pertanent information unless stated regarding to the chief complaint.   Review of Systems:  No headache, visual changes, nausea, vomiting, diarrhea, constipation, dizziness, abdominal pain, skin rash, fevers, chills, night sweats, weight loss, swollen lymph nodes, body aches, joint swelling, chest pain, shortness of breath, mood changes. POSITIVE muscle aches  Objective  Blood pressure 110/68, height 5' 7 (1.702 m), weight 133 lb (60.3 kg).   General: No apparent distress alert and oriented x3 mood and affect normal, dressed appropriately.  HEENT: Pupils equal, extraocular movements intact  Respiratory: Patient's speak in full sentences and does not appear short of breath  Cardiovascular: No lower extremity edema, non tender, no erythema  Knee exam shows arthritic changes noted.  Trace effusion noted of the patellofemoral joint bilaterally. Crepitus noted.  Instability with valgus and varus force  After  informed written and verbal consent, patient was seated on exam table. Right knee was prepped with alcohol swab and utilizing anterolateral approach, patient's right knee space was injected with 4:1  marcaine  0.5%: Kenalog  40mg /dL. Patient tolerated the procedure well without immediate complications.  After informed written and verbal consent, patient was seated on exam table. Left knee was prepped with alcohol swab and utilizing anterolateral approach, patient's left knee space was injected with 4:1  marcaine  0.5%: Kenalog  40mg /dL. Patient tolerated the procedure well without immediate complications.   Impression and Recommendations:     The above documentation has been reviewed and is accurate and complete Litzy Dicker M Wilmetta Speiser, DO

## 2024-05-05 ENCOUNTER — Encounter: Payer: Self-pay | Admitting: Family Medicine

## 2024-05-05 ENCOUNTER — Ambulatory Visit: Admitting: Family Medicine

## 2024-05-05 VITALS — BP 110/68 | Ht 67.0 in | Wt 133.0 lb

## 2024-05-05 DIAGNOSIS — M170 Bilateral primary osteoarthritis of knee: Secondary | ICD-10-CM | POA: Diagnosis not present

## 2024-05-05 NOTE — Patient Instructions (Signed)
 Good to see you! So glad the trip went well, injected both knees again today. See me again in 3 months and we will consider the possibility of viscosupplementation if needed have a great fall!

## 2024-05-05 NOTE — Assessment & Plan Note (Signed)
 Bilateral injections given again today.  Tolerated the procedure well.  Would be due for viscosupplementation again in December potentially.  Would like to further evaluate at that time.  Discussed icing regimen, continue to stay active.  Hold on any type of surgical intervention but is going to be retiring and be on Medicare in January.  Follow-up with me again in 3 months

## 2024-05-12 DIAGNOSIS — N841 Polyp of cervix uteri: Secondary | ICD-10-CM | POA: Diagnosis not present

## 2024-05-14 ENCOUNTER — Other Ambulatory Visit (HOSPITAL_COMMUNITY): Payer: Self-pay

## 2024-05-14 ENCOUNTER — Other Ambulatory Visit: Payer: Self-pay

## 2024-05-14 MED ORDER — LINZESS 145 MCG PO CAPS
145.0000 ug | ORAL_CAPSULE | Freq: Every day | ORAL | 11 refills | Status: AC
  Filled 2024-05-14: qty 30, 30d supply, fill #0
  Filled 2024-06-08 – 2024-06-13 (×4): qty 30, 30d supply, fill #1
  Filled 2024-07-14: qty 30, 30d supply, fill #2
  Filled 2024-08-08: qty 30, 30d supply, fill #3
  Filled 2024-09-07: qty 30, 30d supply, fill #4

## 2024-06-08 ENCOUNTER — Other Ambulatory Visit: Payer: Self-pay | Admitting: Family Medicine

## 2024-06-09 ENCOUNTER — Other Ambulatory Visit (HOSPITAL_BASED_OUTPATIENT_CLINIC_OR_DEPARTMENT_OTHER): Payer: Self-pay

## 2024-06-09 ENCOUNTER — Other Ambulatory Visit: Payer: Self-pay

## 2024-06-09 MED ORDER — CELECOXIB 200 MG PO CAPS
200.0000 mg | ORAL_CAPSULE | Freq: Every day | ORAL | 1 refills | Status: AC | PRN
  Filled 2024-06-09 – 2024-06-13 (×4): qty 180, 90d supply, fill #0

## 2024-06-10 ENCOUNTER — Other Ambulatory Visit (HOSPITAL_COMMUNITY): Payer: Self-pay

## 2024-06-10 ENCOUNTER — Other Ambulatory Visit: Payer: Self-pay

## 2024-06-13 ENCOUNTER — Other Ambulatory Visit: Payer: Self-pay

## 2024-06-13 ENCOUNTER — Other Ambulatory Visit (HOSPITAL_COMMUNITY): Payer: Self-pay

## 2024-06-14 ENCOUNTER — Other Ambulatory Visit (HOSPITAL_COMMUNITY): Payer: Self-pay

## 2024-06-16 ENCOUNTER — Other Ambulatory Visit (HOSPITAL_COMMUNITY): Payer: Self-pay

## 2024-07-14 ENCOUNTER — Other Ambulatory Visit (HOSPITAL_COMMUNITY): Payer: Self-pay

## 2024-08-09 ENCOUNTER — Other Ambulatory Visit: Payer: Self-pay

## 2024-08-22 NOTE — Progress Notes (Unsigned)
 " [name] [address] [place] [address] Phone: [phone] Subjective:   LILLETTE Berwyn Posey, am serving as a scribe for Dr. Arthea Claudene.  I'm seeing this patient by the request  of:  Teresa Channel, MD  CC: Bilateral knee pain follow-up  YEP:Dlagzrupcz  05/05/2024 Bilateral injections given again today.  Tolerated the procedure well.  Would be due for viscosupplementation again in December potentially.  Would like to further evaluate at that time.  Discussed icing regimen, continue to stay active.  Hold on any type of surgical intervention but is going to be retiring and be on Medicare in January.  Follow-up with me again in 3 months     Update 08/25/2024 JACEE ENERSON is a 69 y.o. female coming in with complaint of B knee pain. Patient states that she retired and is taking more water aerobics which is strengthening her legs. Less pain in knees overall. Feels like there is fluid in the knee and that is limited in flexion.   Pain also over L foot in between 2nd and 3rd met heads. Notices pain all the time.        Past Medical History:  Diagnosis Date   Anxiety    History of femoral hernia repair 11/07/2021   Osteoarthritis    PONV (postoperative nausea and vomiting)    had emesis after surgery when went home   Serum calcium elevated    Vaginal atrophy    Past Surgical History:  Procedure Laterality Date   COLONOSCOPY     FOOT SURGERY     left; on multiple occasions   INGUINAL HERNIA REPAIR N/A 11/07/2021   Procedure: HERNIA REPAIR INGUINAL INCARCERATED;  Surgeon: Dasie Leonor CROME, MD;  Location: [facility] OR;  Service: General;  Laterality: N/A;   KNEE ARTHROSCOPY  2001, 2005   left x 2   LAPAROSCOPY N/A 11/07/2021   Procedure: LAPAROSCOPY DIAGNOSTIC;  Surgeon: Dasie Leonor CROME, MD;  Location: [facility] OR;  Service: General;  Laterality: N/A;   SHOULDER ARTHROSCOPY WITH ROTATOR CUFF REPAIR AND SUBACROMIAL DECOMPRESSION Right 12/01/2019   Procedure:  SHOULDER ARTHROSCOPY WITH ROTATOR CUFF REPAIR, DISTAL CLAVICLE EXCISION;  Surgeon: Dozier Soulier, MD;  Location: [facility];  Service: Orthopedics;  Laterality: Right;   SUBACROMIAL DECOMPRESSION Right 12/01/2019   Procedure: SUBACROMIAL DECOMPRESSION;  Surgeon: Dozier Soulier, MD;  Location: [facility];  Service: Orthopedics;  Laterality: Right;   Social History   Socioeconomic History   Marital status: Divorced    Spouse name: Not on file   Number of children: 3   Years of education: Not on file   Highest education level: Not on file  Occupational History    Employer: [employer]  Tobacco Use   Smoking status: Never   Smokeless tobacco: Never  Vaping Use   Vaping status: Never Used  Substance and Sexual Activity   Alcohol use: Yes    Alcohol/week: 3.0 standard drinks of alcohol    Types: 3 Glasses of wine per week    Comment: 2-3 drinks/week   Drug use: No   Sexual activity: Not on file  Other Topics Concern   Not on file  Social History Narrative   Not on file   Social Drivers of Health   Tobacco Use: Low Risk (05/05/2024)   Patient History    Smoking Tobacco Use: Never    Smokeless Tobacco Use: Never    Passive Exposure: Not on file  Financial Resource Strain: Not on file  Food Insecurity: Not on file  Transportation Needs: Not on file  Physical Activity: Not on file  Stress: Not on file  Social Connections: Not on file  Depression (PHQ2-9): Not on file  Alcohol Screen: Not on file  Housing: Not on file  Utilities: Not on file  Health Literacy: Not on file   Allergies[1] Family History  Problem Relation Age of Onset   Heart attack Mother    Heart disease Maternal Grandmother    Alzheimer's disease Father    Colon cancer Neg Hx    Esophageal cancer Neg Hx    Rectal cancer Neg Hx    Stomach cancer Neg Hx     Current Outpatient Medications (Respiratory):    promethazine -dextromethorphan  (PROMETHAZINE -DM) 6.25-15 MG/5ML syrup, Take 5 mLs by mouth at bedtime for 10 days.  Current Outpatient Medications (Analgesics):    acetaminophen  (TYLENOL ) 500 MG tablet, Take 2 tablets (1,000 mg total) by mouth every 6 (six) hours. (Patient taking differently: Take 1,000 mg by mouth as needed.)   celecoxib  (CELEBREX ) 200 MG capsule, Take 1-2 capsules (200-400 mg total) by mouth daily as needed for pain.  Current Outpatient Medications (Other):    cholecalciferol (VITAMIN D3) 25 MCG (1000 UNIT) tablet, Take 1,000 Units by mouth daily. Pt states she is taking 2000 units.   linaclotide  (LINZESS ) 145 MCG CAPS capsule, Take 1 capsule (145 mcg total) by mouth daily at least 30 minutes before the first meal of the day on an empty stomach.   venlafaxine  (EFFEXOR ) 37.5 MG tablet, Take 1 tablet (37.5 mg total) by mouth daily with food.   doxycycline  (VIBRA -TABS) 100 MG tablet, Take 1 tablet (100 mg total) by mouth 2 (two) times daily.   linaclotide  (LINZESS ) 72 MCG capsule, Take 1 capsule (72 mcg total) by mouth daily at least 30 minutes before the first meal on an empty stomach.   Reviewed prior external information including notes and imaging from  primary care provider As well as notes that were available from care everywhere and other healthcare systems.  Past medical history, social, surgical and family history all reviewed in electronic medical record.  No pertanent information unless stated regarding to the chief complaint.   Review of Systems:  No headache, visual changes, nausea, vomiting, diarrhea, constipation, dizziness, abdominal pain, skin rash, fevers, chills, night sweats, weight loss, swollen lymph nodes, body aches, joint swelling, chest pain, shortness of breath, mood changes. POSITIVE muscle aches  Objective  Blood pressure 122/78, pulse 83, height 5' 7 (1.702 m), weight 135 lb (61.2 kg), SpO2 98%.   General: No apparent distress alert and oriented x3 mood and affect normal,  dressed appropriately.  HEENT: Pupils equal, extraocular movements intact  Respiratory: Patient's speak in full sentences and does not appear short of breath  Cardiovascular: No lower extremity edema, non tender, no erythema  Bilateral knees do show some arthritic changes noted.  Instability with valgus and varus force. Has some very mild swelling noted in the popliteal area on the right knee.  Left foot exam shows breakdown of the transverse arch noted.  Some difficulty noted as well.  Patient does have a positive squeeze test noted.  Postsurgical changes noted of the first MTP.  Limited muscular skeletal ultrasound was performed and interpreted by CLAUDENE HUSSAR, M  Limited ultrasound shows mild hypoechoic changes in the 2nd and 3rd interspace consistent with a small neuroma. 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  This included exercises focusing on stretching, strengthening, with significant focus on eccentric aspects.   Long term goals include an improvement in range of motion, strength, endurance as well as avoiding reinjury. Patient's frequency would include in 1-2 times a day, 3-5 times a week for a duration of 6-12 weeks. Exercises for the foot include:  Stretches to help lengthen the lower leg and plantar fascia areas Theraband exercises for the lower leg and ankle to help strengthen the surrounding area- dorsiflexion, plantarflexion, inversion, eversion Massage rolling on the plantar surface of the foot with a frozen bottle, tennis ball or golf ball Towel or marble pick-ups to strengthen the plantar surface of the foot Weight bearing exercises to increase balance and overall stability   foot proper technique shown and discussed handout in great detail with ATC.  All questions were discussed and answered.     Impression and Recommendations:     The above documentation has been reviewed  and is accurate and complete Abdimalik Mayorquin M Telecia Larocque, DO       [1]  Allergies Allergen Reactions   Penicillins     REACTION: hives   "

## 2024-08-25 ENCOUNTER — Ambulatory Visit: Admitting: Family Medicine

## 2024-08-25 ENCOUNTER — Encounter: Payer: Self-pay | Admitting: Family Medicine

## 2024-08-25 ENCOUNTER — Telehealth: Payer: Self-pay

## 2024-08-25 VITALS — BP 122/78 | HR 83 | Ht 67.0 in | Wt 135.0 lb

## 2024-08-25 DIAGNOSIS — G5762 Lesion of plantar nerve, left lower limb: Secondary | ICD-10-CM | POA: Diagnosis not present

## 2024-08-25 DIAGNOSIS — M79672 Pain in left foot: Secondary | ICD-10-CM

## 2024-08-25 DIAGNOSIS — M170 Bilateral primary osteoarthritis of knee: Secondary | ICD-10-CM | POA: Diagnosis not present

## 2024-08-25 NOTE — Assessment & Plan Note (Signed)
 Postsurgical changes noted of the fourth interspace.  Will monitor.  Discussed icing regimen, discussed which activities to do and which ones to avoid.  Increase activity slowly.  Follow-up again in 6 to 12 weeks

## 2024-08-25 NOTE — Assessment & Plan Note (Signed)
 Arthritic changes noted.  Discussed icing regimen and home exercises, which activities to do and which ones to avoid.  Increase activity slowly.  Discussed icing regimen.  Worsening pain will consider injections again.  Held today with patient having retinal surgery so did not want to do steroids earlier.

## 2024-08-25 NOTE — Patient Instructions (Addendum)
 Lotion to feet Knee is ok for now Transverse arch ex Will get approval for gel Spenco Total Support Ex New Balance cross trainer See me in 2-3 months

## 2024-08-25 NOTE — Telephone Encounter (Signed)
-----   Message from Berwyn Posey sent at 08/25/2024  8:55 AM EST ----- Regarding: visco Can you please run patient for visco injections for both knees?  Thanks

## 2024-08-25 NOTE — Telephone Encounter (Signed)
 Monovisc benefits ran for bilateral knee Case [phone]

## 2024-08-26 NOTE — Telephone Encounter (Addendum)
 Please schedule patient when medication is stocked   MONOVISC authorized for bilateral knee NO PRE CERT REQUIRED Patient responsible for 20% coinsurance Copay $25 Deductible does not apply OOP MAX $3900 has met $50 Once OOP has been met coverage goes to 100% Reference # [id number]

## 2024-09-07 ENCOUNTER — Other Ambulatory Visit (HOSPITAL_COMMUNITY): Payer: Self-pay

## 2024-09-08 ENCOUNTER — Other Ambulatory Visit: Payer: Self-pay

## 2024-09-11 ENCOUNTER — Other Ambulatory Visit (HOSPITAL_COMMUNITY): Payer: Self-pay
